# Patient Record
Sex: Female | Born: 1975 | Race: White | Hispanic: No | Marital: Single | Smoking: Never smoker
Health system: Southern US, Community
[De-identification: ages and names within clinical notes are randomized; demographics above are authoritative.]

## PROBLEM LIST (undated history)

## (undated) DIAGNOSIS — M51369 Other intervertebral disc degeneration, lumbar region without mention of lumbar back pain or lower extremity pain: Secondary | ICD-10-CM

## (undated) DIAGNOSIS — I10 Essential (primary) hypertension: Secondary | ICD-10-CM

## (undated) DIAGNOSIS — M5136 Other intervertebral disc degeneration, lumbar region: Secondary | ICD-10-CM

## (undated) DIAGNOSIS — I059 Rheumatic mitral valve disease, unspecified: Secondary | ICD-10-CM

## (undated) DIAGNOSIS — H9120 Sudden idiopathic hearing loss, unspecified ear: Secondary | ICD-10-CM

## (undated) DIAGNOSIS — R519 Headache, unspecified: Secondary | ICD-10-CM

## (undated) DIAGNOSIS — M5126 Other intervertebral disc displacement, lumbar region: Secondary | ICD-10-CM

## (undated) DIAGNOSIS — M549 Dorsalgia, unspecified: Secondary | ICD-10-CM

## (undated) DIAGNOSIS — C4491 Basal cell carcinoma of skin, unspecified: Secondary | ICD-10-CM

## (undated) HISTORY — DX: Basal cell carcinoma of skin, unspecified: C44.91

## (undated) HISTORY — DX: Other intervertebral disc degeneration, lumbar region: M51.36

## (undated) HISTORY — DX: Other intervertebral disc degeneration, lumbar region without mention of lumbar back pain or lower extremity pain: M51.369

## (undated) HISTORY — DX: Rheumatic mitral valve disease, unspecified: I05.9

## (undated) HISTORY — DX: Headache, unspecified: R51.9

## (undated) HISTORY — DX: Sudden idiopathic hearing loss, unspecified ear: H91.20

---

## 2006-10-25 ENCOUNTER — Emergency Department: Payer: Self-pay | Admitting: Emergency Medicine

## 2006-10-25 ENCOUNTER — Other Ambulatory Visit: Payer: Self-pay

## 2006-12-19 ENCOUNTER — Ambulatory Visit: Payer: Self-pay | Admitting: Neurology

## 2007-08-14 HISTORY — PX: OTHER SURGICAL HISTORY: SHX169

## 2013-05-20 ENCOUNTER — Other Ambulatory Visit: Payer: Self-pay | Admitting: Physical Medicine and Rehabilitation

## 2013-05-20 ENCOUNTER — Ambulatory Visit: Payer: Self-pay | Admitting: Physical Medicine and Rehabilitation

## 2013-05-20 DIAGNOSIS — M545 Low back pain, unspecified: Secondary | ICD-10-CM

## 2013-05-24 ENCOUNTER — Ambulatory Visit
Admission: RE | Admit: 2013-05-24 | Discharge: 2013-05-24 | Disposition: A | Discharge status: Home / Self Care | Payer: BC Managed Care – PPO | Source: Ambulatory Visit | Attending: Physical Medicine and Rehabilitation | Admitting: Physical Medicine and Rehabilitation

## 2013-05-24 DIAGNOSIS — M545 Low back pain, unspecified: Secondary | ICD-10-CM

## 2016-04-13 ENCOUNTER — Emergency Department: Payer: BC Managed Care – PPO

## 2016-04-13 ENCOUNTER — Emergency Department
Admission: EM | Admit: 2016-04-13 | Discharge: 2016-04-13 | Disposition: A | Discharge status: Home / Self Care | Payer: BC Managed Care – PPO | Attending: Emergency Medicine | Admitting: Emergency Medicine

## 2016-04-13 ENCOUNTER — Encounter: Payer: Self-pay | Admitting: Emergency Medicine

## 2016-04-13 ENCOUNTER — Other Ambulatory Visit: Payer: Self-pay

## 2016-04-13 DIAGNOSIS — I10 Essential (primary) hypertension: Secondary | ICD-10-CM | POA: Insufficient documentation

## 2016-04-13 DIAGNOSIS — R079 Chest pain, unspecified: Secondary | ICD-10-CM | POA: Insufficient documentation

## 2016-04-13 HISTORY — DX: Essential (primary) hypertension: I10

## 2016-04-13 HISTORY — DX: Dorsalgia, unspecified: M54.9

## 2016-04-13 LAB — CBC
HCT: 47.5 % — ABNORMAL HIGH (ref 35.0–47.0)
Hemoglobin: 15.9 g/dL (ref 12.0–16.0)
MCH: 27 pg (ref 26.0–34.0)
MCHC: 33.5 g/dL (ref 32.0–36.0)
MCV: 80.6 fL (ref 80.0–100.0)
PLATELETS: 223 10*3/uL (ref 150–440)
RBC: 5.89 MIL/uL — AB (ref 3.80–5.20)
RDW: 14.8 % — AB (ref 11.5–14.5)
WBC: 13.4 10*3/uL — ABNORMAL HIGH (ref 3.6–11.0)

## 2016-04-13 LAB — BASIC METABOLIC PANEL
Anion gap: 9 (ref 5–15)
BUN: 17 mg/dL (ref 6–20)
CALCIUM: 9.7 mg/dL (ref 8.9–10.3)
CO2: 22 mmol/L (ref 22–32)
CREATININE: 0.66 mg/dL (ref 0.44–1.00)
Chloride: 104 mmol/L (ref 101–111)
GFR calc non Af Amer: >60 mL/min (ref >60)
Glucose, Bld: 131 mg/dL — ABNORMAL HIGH (ref 65–99)
Potassium: 4.1 mmol/L (ref 3.5–5.1)
SODIUM: 135 mmol/L (ref 135–145)

## 2016-04-13 LAB — TROPONIN I: Troponin I: <0.03 ng/mL (ref <0.03)

## 2016-04-13 MED ORDER — IOPAMIDOL (ISOVUE-370) INJECTION 76%
75.0000 mL | Freq: Once | INTRAVENOUS | Status: AC | PRN
  Administered 2016-04-13: 75 mL via INTRAVENOUS
  Filled 2016-04-13: qty 75

## 2016-04-13 NOTE — ED Provider Notes (Signed)
[facility] Emergency Department Provider Note   ____________________________________________    I have reviewed the triage vital signs and the nursing notes.   HISTORY  Chief Complaint Chest Pain     HPI Zoe Daniels is a 40 y.o. female who presents with multiple complaints but she is most concerned about mild chest pain she is having. She describes it as a dull ache that is usually in the center of her chest but is occasionally on the right side as well. She reports it started intermittently yesterday and this morning was more constant. She reports it is more annoying than anything. She denies fevers or chills. She denies shortness of breath. No pleurisy. She also reports that she has been feeling fatigued over the last 3 weeks but she did she attributed this to her vacation in [place]. She denies diaphoresis. No history of heart attack. She sees a cardiologist yearly for left ventricular hypertrophy. No calf pain or swelling   Past Medical History:  Diagnosis Date  . Back pain   . Hypertension     There are no active problems to display for this patient.   History reviewed. No pertinent surgical history.  Prior to Admission medications   Not on File     Allergies Review of patient's allergies indicates no known allergies.  History reviewed. No pertinent family history.  Social History Social History  Substance Use Topics  . Smoking status: Never Smoker  . Smokeless tobacco: Never Used  . Alcohol use No    Review of Systems  Constitutional: No fever/chills Eyes: No visual changes.  ENT: No sore throat. Cardiovascular: As above Respiratory: Denies shortness of breath. Gastrointestinal: No abdominal pain.  No nausea, no vomiting.   Genitourinary: Negative for dysuria. Musculoskeletal: Negative for back pain. Skin: Negative for rash. Neurological: Negative for headaches or weakness  10-point ROS otherwise  negative.  ____________________________________________   PHYSICAL EXAM:  VITAL SIGNS: ED Triage Vitals [04/13/16 1356]  Enc Vitals Group     BP      Pulse      Resp      Temp      Temp src      SpO2      Weight 247 lb (112 kg)     Height 5\' 9"  (1.753 m)     Head Circumference      Peak Flow      Pain Score 3     Pain Loc      Pain Edu?      Excl. in [place]?     Constitutional: Alert and oriented. No acute distress. Pleasant and interactive Eyes: Conjunctivae are normal.  Head: Atraumatic. Nose: No congestion/rhinnorhea. Mouth/Throat: Mucous membranes are moist.   Neck:  Painless ROM Cardiovascular: Normal rate, regular rhythm. Grossly normal heart sounds.  Good peripheral circulation. Respiratory: Normal respiratory effort.  No retractions. Lungs CTAB. Gastrointestinal: Soft and nontender. No distention.  No CVA tenderness. Genitourinary: deferred Musculoskeletal: No lower extremity tenderness nor edema.  Warm and well perfused Neurologic:  Normal speech and language. No gross focal neurologic deficits are appreciated.  Skin:  Skin is warm, dry and intact. No rash noted. Psychiatric: Mood and affect are normal. Speech and behavior are normal.  ____________________________________________   LABS (all labs ordered are listed, but only abnormal results are displayed)  Labs Reviewed  BASIC METABOLIC PANEL - Abnormal; Notable for the following:       Result Value   Glucose, Bld  131 (*)    All other components within normal limits  CBC - Abnormal; Notable for the following:    WBC 13.4 (*)    RBC 5.89 (*)    HCT 47.5 (*)    RDW 14.8 (*)    All other components within normal limits  TROPONIN I   ____________________________________________  EKG  ED ECG REPORT I, Lavonia Drafts, the attending physician, personally viewed and interpreted this ECG.  Date: 04/13/2016 EKG Time: 1:58 PM Rate: 82 Rhythm: normal sinus rhythm QRS Axis: normal Intervals:  normal ST/T Wave abnormalities: normal Conduction Disturbances: none Narrative Interpretation: unremarkable  ____________________________________________  RADIOLOGY  Chest x-ray unremarkable CT angiography shows negative for PE ____________________________________________   PROCEDURES  Procedure(s) performed: No    Critical Care performed: No ____________________________________________   INITIAL IMPRESSION / ASSESSMENT AND PLAN / ED COURSE  Pertinent labs & imaging results that were available during my care of the patient were reviewed by me and considered in my medical decision making (see chart for details).  Lab work is overall unremarkable and the patient is well-appearing. EKG is reassuring. Given onset of fatigue after a flight and now chest pain I have some concern about a PE, we will obtain CT angio and reevaluate.  Clinical Course  Value Comment By Time  CO2: 22 (Reviewed) Lavonia Drafts, MD 09/01 1751  CT angiography is normal, patient is not a smoker. Second troponin is normal. Patient has follow-up with cardiology. She does return if worsening of her symptoms but overall her workup is reassuring ____________________________________________   FINAL CLINICAL IMPRESSION(S) / ED DIAGNOSES  Final diagnoses:  Chest pain, unspecified chest pain type      NEW MEDICATIONS STARTED DURING THIS VISIT:  New Prescriptions   No medications on file     Note:  This document was prepared using Dragon voice recognition software and may include unintentional dictation errors.    Lavonia Drafts, MD 04/13/16 [phone]

## 2016-04-13 NOTE — ED Triage Notes (Signed)
Pt to ed with c/o chest pain intermittently since Monday, worse sharp pain on Wednesday. Today, headache, chest pain, sob, lethargy.

## 2016-09-11 ENCOUNTER — Emergency Department: Payer: Worker's Compensation

## 2016-09-11 ENCOUNTER — Emergency Department
Admission: EM | Admit: 2016-09-11 | Discharge: 2016-09-11 | Disposition: A | Discharge status: Home / Self Care | Payer: Worker's Compensation | Attending: Emergency Medicine | Admitting: Emergency Medicine

## 2016-09-11 DIAGNOSIS — I10 Essential (primary) hypertension: Secondary | ICD-10-CM | POA: Insufficient documentation

## 2016-09-11 DIAGNOSIS — Z23 Encounter for immunization: Secondary | ICD-10-CM | POA: Diagnosis not present

## 2016-09-11 DIAGNOSIS — Y92480 Sidewalk as the place of occurrence of the external cause: Secondary | ICD-10-CM | POA: Diagnosis not present

## 2016-09-11 DIAGNOSIS — W01198A Fall on same level from slipping, tripping and stumbling with subsequent striking against other object, initial encounter: Secondary | ICD-10-CM | POA: Insufficient documentation

## 2016-09-11 DIAGNOSIS — Y990 Civilian activity done for income or pay: Secondary | ICD-10-CM | POA: Diagnosis not present

## 2016-09-11 DIAGNOSIS — S5002XA Contusion of left elbow, initial encounter: Secondary | ICD-10-CM | POA: Diagnosis not present

## 2016-09-11 DIAGNOSIS — S59902A Unspecified injury of left elbow, initial encounter: Secondary | ICD-10-CM | POA: Diagnosis present

## 2016-09-11 DIAGNOSIS — Y9301 Activity, walking, marching and hiking: Secondary | ICD-10-CM | POA: Diagnosis not present

## 2016-09-11 DIAGNOSIS — W19XXXA Unspecified fall, initial encounter: Secondary | ICD-10-CM

## 2016-09-11 DIAGNOSIS — R51 Headache: Secondary | ICD-10-CM | POA: Diagnosis not present

## 2016-09-11 HISTORY — DX: Other intervertebral disc displacement, lumbar region: M51.26

## 2016-09-11 MED ORDER — TETANUS-DIPHTH-ACELL PERTUSSIS 5-2.5-18.5 LF-MCG/0.5 IM SUSP
0.5000 mL | Freq: Once | INTRAMUSCULAR | Status: AC
  Administered 2016-09-11: 0.5 mL via INTRAMUSCULAR
  Filled 2016-09-11: qty 0.5

## 2016-09-11 MED ORDER — MELOXICAM 15 MG PO TABS: 15.0000 mg | ORAL_TABLET | Freq: Every day | ORAL | 0 refills | Status: DC

## 2016-09-11 NOTE — ED Notes (Signed)
Pt fell while at work on ice and injured her left arm/elbow - c/o pain but still has full ROM - pt did hit head but did not loss consciousness and requested not to have head evaluated - this was discussed with Dr Karma Greaser

## 2016-09-11 NOTE — ED Notes (Signed)
Pt denies loss of consciousness - denies nausea/vomiting - denies headache - discussed with Dr Karma Greaser and he states pt does not need a CT scan and to make her a level 4

## 2016-09-11 NOTE — ED Provider Notes (Signed)
[facility] Emergency Department Provider Note  ____________________________________________  Time seen: Approximately 4:37 PM  I have reviewed the triage vital signs and the nursing notes.   HISTORY  Chief Complaint Fall    HPI Zoe Daniels is a 41 y.o. female [facility] emergency department complaining of left elbow pain status post fall. Patient states that she was walking this morning when she slipped on a patch of ice falling backwards landing on her left elbow. Patient reports that she did hit her head and has had a headache since denies any loss consciousness. She also reports some neck stiffness radiating into her left shoulder. Patient is most concerned about her left elbow pain, swelling, abrasion. Patient reports that she has had some numbness and tingling running from her elbow down into her left hand. No other injury or complaint. She is unsure of her last tetanus shot.  When discussing patient's injuries, she adamantly refuses CT scan of the head and neck. She requests "only want an x-ray of my elbow to make sure it didn't break something.." I have advised patient that due to the mechanism of injury I cannot rule out abnormality both intracranially, or cervical spine.She verbalizes understanding of this and adamantly refuses head CT and cervical spine imaging.   Past Medical History:  Diagnosis Date  . Back pain   . Hypertension   . Lumbar herniated disc     There are no active problems to display for this patient.   History reviewed. No pertinent surgical history.  Prior to Admission medications   Medication Sig Start Date End Date Taking? Authorizing Provider  meloxicam (MOBIC) 15 MG tablet Take 1 tablet (15 mg total) by mouth daily. 09/11/16   Charline Bills Cuthriell, PA-C    Allergies Patient has no known allergies.  No family history on file.  Social History Social History  Substance Use Topics  . Smoking status: Never Smoker  .  Smokeless tobacco: Never Used  . Alcohol use No     Review of Systems  Constitutional: No fever/chills Eyes: No visual changes.  Cardiovascular: no chest pain. Respiratory: no cough. No SOB. Gastrointestinal: No abdominal pain.  No nausea, no vomiting.  Musculoskeletal: Positive for left elbow pain Skin: As it for abrasion to the posterior left elbow. Neurological: Positive for headache but denies focal weakness or numbness. 10-point ROS otherwise negative.  ____________________________________________   PHYSICAL EXAM:  VITAL SIGNS: ED Triage Vitals  Enc Vitals Group     BP 09/11/16 1513 (!) 151/103     Pulse Rate 09/11/16 1513 64     Resp 09/11/16 1513 16     Temp 09/11/16 1513 98.1 F (36.7 C)     Temp Source 09/11/16 1513 Oral     SpO2 09/11/16 1513 100 %     Weight 09/11/16 1513 237 lb (107.5 kg)     Height 09/11/16 1513 5\' 9"  (1.753 m)     Head Circumference --      Peak Flow --      Pain Score 09/11/16 1514 7     Pain Loc --      Pain Edu? --      Excl. in [place]? --      Constitutional: Alert and oriented. Well appearing and in no acute distress. Eyes: Conjunctivae are normal. PERRL. EOMI. Head: Atraumatic. No ecchymosis, contusion, abrasion, laceration noted. Patient is nontender to palpation over the osseous structures of the skull. No battle signs. No raccoon eyes. No cervical symptoms  with drainage from the ears or nares. ENT:      Ears:       Nose: No congestion/rhinnorhea.      Mouth/Throat: Mucous membranes are moist.  Neck: No stridor.  Diffuse, midline cervical spine tenderness to palpation. No palpable abnormality. No step-off. Patient is also tender to palpation aside paraspinal muscle group.  Cardiovascular: Normal rate, regular rhythm. Normal S1 and S2.  Good peripheral circulation. Respiratory: Normal respiratory effort without tachypnea or retractions. Lungs CTAB. Good air entry to the bases with no decreased or absent breath  sounds. Musculoskeletal: Full range of motion to all extremities. No gross deformities appreciated. Full range of motion to left elbow upon inspection. Patient is tender to palpation of the olecranon process. No palpable abnormality. Patient is otherwise nontender to palpation. Full range of motion. Examination of the shoulder and wrist are unremarkable. Sensation intact 5 digits. Capillary refill less than 2 seconds all digits. Neurologic:  Normal speech and language. No gross focal neurologic deficits are appreciated.  Skin:  Skin is warm, dry and intact. No rash noted. Psychiatric: Mood and affect are normal. Speech and behavior are normal. Patient exhibits appropriate insight and judgement.   ____________________________________________   LABS (all labs ordered are listed, but only abnormal results are displayed)  Labs Reviewed - No data to display ____________________________________________  EKG   ____________________________________________  RADIOLOGY Diamantina Providence Cuthriell, personally viewed and evaluated these images (plain radiographs) as part of my medical decision making, as well as reviewing the written report by the radiologist.  Dg Elbow Complete Left  Result Date: 09/11/2016 CLINICAL DATA:  [place] today on ice onto LEFT elbow, laceration and pain posteriorly, initial encounter EXAM: LEFT ELBOW - COMPLETE 3+ VIEW COMPARISON:  None FINDINGS: Osseous mineralization normal. Joint spaces preserved. No acute fracture, dislocation or bone destruction. Minimal dorsal soft tissue swelling overlying the olecranon. No joint effusion. IMPRESSION: Normal exam. Electronically Signed   By: Lavonia Dana M.D.   On: 09/11/2016 17:13    ____________________________________________    PROCEDURES  Procedure(s) performed:    Procedures    Medications  Tdap (BOOSTRIX) injection 0.5 mL (0.5 mLs Intramuscular Given 09/11/16 1652)      ____________________________________________   INITIAL IMPRESSION / ASSESSMENT AND PLAN / ED COURSE  Pertinent labs & imaging results that were available during my care of the patient were reviewed by me and considered in my medical decision making (see chart for details).  Review of the [place] CSRS was performed in accordance of the [place] prior to dispensing any controlled drugs.     Patient's diagnosis is consistent with left elbow contusion after fall. Patient did hit her head and was complaining of neck pain and headache but adamantly refused CT of the head and neck. Patient requested x-ray of the left elbow only. This returned. Her results with no indication of acute osseous abnormality. Exam is reassuring.. Patient will be discharged home with prescriptions for anti-inflammatories for symptom control. Patient is on chronic pain medication and she may take same.. Patient is to follow up with primary care as needed or otherwise directed. Patient is given ED precautions to return to the ED for any worsening or new symptoms.     ____________________________________________  FINAL CLINICAL IMPRESSION(S) / ED DIAGNOSES  Final diagnoses:  Fall, initial encounter  Contusion of left elbow, initial encounter      NEW MEDICATIONS STARTED DURING THIS VISIT:  Discharge Medication List as of 09/11/2016  5:28 PM    START  taking these medications   Details  meloxicam (MOBIC) 15 MG tablet Take 1 tablet (15 mg total) by mouth daily., Starting Tue 09/11/2016, Print            This chart was dictated using voice recognition software/Dragon. Despite best efforts to proofread, errors can occur which can change the meaning. Any change was purely unintentional.    Darletta Moll, PA-C 09/11/16 [place], MD 09/11/16 2250

## 2016-09-11 NOTE — ED Triage Notes (Signed)
Pt reports that she fell at work today - reports that she fell from a standing position - slid on ice and hit head on pavement and landed on left arm and elbow which are now hurting - pt is able to move left arm but is c/o numbness in left hand and fingers

## 2018-05-07 ENCOUNTER — Other Ambulatory Visit: Payer: Self-pay | Admitting: Physical Medicine and Rehabilitation

## 2018-05-07 DIAGNOSIS — M5412 Radiculopathy, cervical region: Secondary | ICD-10-CM

## 2018-05-07 DIAGNOSIS — M5416 Radiculopathy, lumbar region: Secondary | ICD-10-CM

## 2018-05-14 ENCOUNTER — Other Ambulatory Visit: Payer: Self-pay | Admitting: Physical Medicine and Rehabilitation

## 2018-05-14 DIAGNOSIS — M5416 Radiculopathy, lumbar region: Secondary | ICD-10-CM

## 2018-05-18 ENCOUNTER — Ambulatory Visit
Admission: RE | Admit: 2018-05-18 | Discharge: 2018-05-18 | Disposition: A | Discharge status: Home / Self Care | Payer: BC Managed Care – PPO | Source: Ambulatory Visit | Attending: Physical Medicine and Rehabilitation | Admitting: Physical Medicine and Rehabilitation

## 2018-05-18 DIAGNOSIS — M5416 Radiculopathy, lumbar region: Secondary | ICD-10-CM

## 2018-05-22 ENCOUNTER — Ambulatory Visit: Payer: Self-pay

## 2018-09-10 ENCOUNTER — Other Ambulatory Visit: Payer: Self-pay | Admitting: Physician Assistant

## 2018-09-10 DIAGNOSIS — H918X9 Other specified hearing loss, unspecified ear: Secondary | ICD-10-CM

## 2018-09-18 ENCOUNTER — Other Ambulatory Visit: Payer: Self-pay

## 2018-09-18 ENCOUNTER — Ambulatory Visit
Admission: RE | Admit: 2018-09-18 | Discharge: 2018-09-18 | Disposition: A | Discharge status: Home / Self Care | Payer: BC Managed Care – PPO | Source: Ambulatory Visit | Attending: Physician Assistant | Admitting: Physician Assistant

## 2018-09-18 DIAGNOSIS — H918X9 Other specified hearing loss, unspecified ear: Secondary | ICD-10-CM

## 2018-09-18 MED ORDER — GADOBENATE DIMEGLUMINE 529 MG/ML IV SOLN
20.0000 mL | Freq: Once | INTRAVENOUS | Status: AC | PRN
  Administered 2018-09-18: 20 mL via INTRAVENOUS

## 2018-12-29 ENCOUNTER — Encounter: Payer: Self-pay | Admitting: *Deleted

## 2018-12-29 ENCOUNTER — Ambulatory Visit (INDEPENDENT_AMBULATORY_CARE_PROVIDER_SITE_OTHER): Payer: BC Managed Care – PPO | Admitting: Neurology

## 2018-12-29 ENCOUNTER — Other Ambulatory Visit: Payer: Self-pay

## 2018-12-29 VITALS — BP 156/98 | HR 83 | Temp 97.7°F | Ht 69.0 in | Wt 246.0 lb

## 2018-12-29 DIAGNOSIS — G43709 Chronic migraine without aura, not intractable, without status migrainosus: Secondary | ICD-10-CM | POA: Diagnosis not present

## 2018-12-29 DIAGNOSIS — R42 Dizziness and giddiness: Secondary | ICD-10-CM

## 2018-12-29 DIAGNOSIS — IMO0002 Reserved for concepts with insufficient information to code with codable children: Secondary | ICD-10-CM

## 2018-12-29 MED ORDER — SUMATRIPTAN SUCCINATE 50 MG PO TABS: 50.0000 mg | ORAL_TABLET | ORAL | 6 refills | Status: DC | PRN

## 2018-12-29 MED ORDER — ONDANSETRON 4 MG PO TBDP: 4.0000 mg | ORAL_TABLET | Freq: Three times a day (TID) | ORAL | 6 refills | Status: DC | PRN

## 2018-12-29 MED ORDER — TOPIRAMATE 100 MG PO TABS: 100.0000 mg | ORAL_TABLET | Freq: Two times a day (BID) | ORAL | 11 refills | Status: DC
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
## 2019-03-17 ENCOUNTER — Telehealth: Payer: Self-pay | Admitting: Neurology

## 2019-03-17 NOTE — Telephone Encounter (Signed)
Pt called in and stated the meds that were prescribed to her arent working at all she has tried rotating them to help but there was no improvement .  SUMAtriptan (IMITREX) 50 MG tablet  topiramate (TOPAMAX) 100 MG tablet

## 2019-03-18 NOTE — Telephone Encounter (Signed)
Called the patient to inform her of the recommendation that Dr Krista Blue has made. There was no answer LVM informing the pt to call back to discuss.

## 2019-03-18 NOTE — Telephone Encounter (Signed)
I can add on nortriptyline 10mg , titrating to 20mg  qhs as preventive medications, keep follow up appt in Sept, if she wants to come in earlier, ok to reschedule earlier.

## 2019-03-19 MED ORDER — NORTRIPTYLINE HCL 10 MG PO CAPS: ORAL_CAPSULE | ORAL | 0 refills | Status: DC

## 2019-03-19 NOTE — Telephone Encounter (Signed)
Pt returned call and I was able to review with her that Dr Krista Blue is willing to try another preventative medication. Advised to continue using the topiramate for prevention and imitrex should only be used when she feels a migraine coming on as a emergency relief medication. Informed the patient that Dr. Krista Blue would recommend trying a medication called nortipyline. Advised on the directions informed by Dr Krista Blue. Informed this will be used also as a prevention for migraines. Informed that we will send this order over to her pharmacy on file which was confirmed. Advised to take this and keep the upcoming apt in sept to discuss If she is getting relief. Pt verbalized understanding.

## 2019-03-19 NOTE — Addendum Note (Signed)
Addended by: Darleen Crocker on: 03/19/2019 08:17 AM   Modules accepted: Orders

## 2019-04-24 ENCOUNTER — Other Ambulatory Visit: Payer: Self-pay | Admitting: Neurology

## 2019-04-29 ENCOUNTER — Ambulatory Visit: Payer: BC Managed Care – PPO | Admitting: Internal Medicine

## 2019-04-29 ENCOUNTER — Other Ambulatory Visit: Payer: Self-pay

## 2019-04-29 ENCOUNTER — Encounter: Payer: Self-pay | Admitting: Internal Medicine

## 2019-04-29 VITALS — BP 160/112 | HR 95 | Ht 69.0 in | Wt 247.5 lb

## 2019-04-29 DIAGNOSIS — I059 Rheumatic mitral valve disease, unspecified: Secondary | ICD-10-CM

## 2019-04-29 DIAGNOSIS — E785 Hyperlipidemia, unspecified: Secondary | ICD-10-CM

## 2019-04-29 DIAGNOSIS — R0789 Other chest pain: Secondary | ICD-10-CM | POA: Insufficient documentation

## 2019-04-29 DIAGNOSIS — I10 Essential (primary) hypertension: Secondary | ICD-10-CM | POA: Diagnosis not present

## 2019-04-29 DIAGNOSIS — R002 Palpitations: Secondary | ICD-10-CM | POA: Diagnosis not present

## 2019-04-29 MED ORDER — LISINOPRIL 20 MG PO TABS: 20.0000 mg | ORAL_TABLET | Freq: Every day | ORAL | 3 refills | Status: DC
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
## 2019-04-29 NOTE — Patient Instructions (Addendum)
Medication Instructions:  Your physician has recommended you make the following change in your medication:   START Lisinopril 20mg  daily. An Rx has been sent to your pharmacy  If you need a refill on your cardiac medications before your next appointment, please call your pharmacy.   Lab work: Teacher, English as a foreign language recommends that you return for lab work in: 2 weeks (Bmet)  If you have labs (blood work) drawn today and your tests are completely normal, you will receive your results only by: Marland Kitchen MyChart Message (if you have MyChart) OR . A paper copy in the mail If you have any lab test that is abnormal or we need to change your treatment, we will call you to review the results.  Testing/Procedures: None ordered  Follow-Up: At [facility], you and your health needs are our priority.  As part of our continuing mission to provide you with exceptional heart care, we have created designated Provider Care Teams.  These Care Teams include your primary Cardiologist (physician) and Advanced Practice Providers (APPs -  Physician Assistants and Nurse Practitioners) who all work together to provide you with the care you need, when you need it. You will need a follow up appointment in 4 weeks.   You may see  Dr. Saunders Revel or one of the following Advanced Practice Providers on your designated Care Team:   Murray Hodgkins, NP Christell Faith, PA-C . Marrianne Mood, PA-C  Any Other Special Instructions Will Be Listed Below (If Applicable). N/A

## 2019-04-30 LAB — BASIC METABOLIC PANEL
BUN/Creatinine Ratio: 13 (ref 9–23)
BUN: 10 mg/dL (ref 6–24)
CO2: 22 mmol/L (ref 20–29)
Calcium: 9.3 mg/dL (ref 8.7–10.2)
Chloride: 98 mmol/L (ref 96–106)
Creatinine, Ser: 0.77 mg/dL (ref 0.57–1.00)
GFR calc Af Amer: 110 mL/min/{1.73_m2} (ref >59)
GFR calc non Af Amer: 96 mL/min/{1.73_m2} (ref >59)
Glucose: 193 mg/dL — ABNORMAL HIGH (ref 65–99)
Potassium: 3.8 mmol/L (ref 3.5–5.2)
Sodium: 136 mmol/L (ref 134–144)

## 2019-05-01 ENCOUNTER — Ambulatory Visit: Payer: BC Managed Care – PPO | Admitting: Neurology

## 2019-05-05 NOTE — Addendum Note (Signed)
Addended by: Britt Bottom on: 05/05/2019 04:33 PM   Modules accepted: Orders
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
## 2019-05-13 ENCOUNTER — Encounter: Payer: Self-pay | Admitting: Neurology

## 2019-05-13 ENCOUNTER — Ambulatory Visit: Payer: BC Managed Care – PPO | Admitting: Neurology

## 2019-05-13 ENCOUNTER — Other Ambulatory Visit: Payer: Self-pay

## 2019-05-13 ENCOUNTER — Telehealth: Payer: Self-pay | Admitting: Neurology

## 2019-05-13 ENCOUNTER — Other Ambulatory Visit
Admission: RE | Admit: 2019-05-13 | Discharge: 2019-05-13 | Disposition: A | Discharge status: Home / Self Care | Payer: BC Managed Care – PPO | Source: Ambulatory Visit | Attending: Internal Medicine | Admitting: Internal Medicine

## 2019-05-13 VITALS — BP 139/75 | HR 114 | Temp 97.7°F | Ht 69.0 in | Wt 241.8 lb

## 2019-05-13 DIAGNOSIS — G43709 Chronic migraine without aura, not intractable, without status migrainosus: Secondary | ICD-10-CM

## 2019-05-13 DIAGNOSIS — IMO0002 Reserved for concepts with insufficient information to code with codable children: Secondary | ICD-10-CM

## 2019-05-13 DIAGNOSIS — I10 Essential (primary) hypertension: Secondary | ICD-10-CM | POA: Diagnosis present

## 2019-05-13 LAB — BASIC METABOLIC PANEL
Anion gap: 14 (ref 5–15)
BUN: 16 mg/dL (ref 6–20)
CO2: 23 mmol/L (ref 22–32)
Calcium: 8.7 mg/dL — ABNORMAL LOW (ref 8.9–10.3)
Chloride: 97 mmol/L — ABNORMAL LOW (ref 98–111)
Creatinine, Ser: 0.75 mg/dL (ref 0.44–1.00)
GFR calc Af Amer: >60 mL/min (ref >60)
GFR calc non Af Amer: >60 mL/min (ref >60)
Glucose, Bld: 212 mg/dL — ABNORMAL HIGH (ref 70–99)
Potassium: 3.5 mmol/L (ref 3.5–5.1)
Sodium: 134 mmol/L — ABNORMAL LOW (ref 135–145)

## 2019-05-13 MED ORDER — RIZATRIPTAN BENZOATE 10 MG PO TABS: 10.0000 mg | ORAL_TABLET | ORAL | 0 refills | Status: DC | PRN

## 2019-05-13 MED ORDER — AIMOVIG 70 MG/ML ~~LOC~~ SOAJ: 70.0000 mg | SUBCUTANEOUS | 5 refills | Status: DC

## 2019-05-13 NOTE — Patient Instructions (Signed)
1. We will stop nortriptyline 2. Continue Topamax 3. We will start Aimovig 70 mg monthly injection  4. Stop Imitrex, switch to Maxalt

## 2019-05-13 NOTE — Telephone Encounter (Signed)
The patient has asked that her office notes be sent to her ENT, Dr. Pryor Ochoa at [facility] once Dr. Krista Blue has signed off on visit today.

## 2019-05-13 NOTE — Progress Notes (Signed)
I have reviewed and agreed above plan. 

## 2019-05-25 NOTE — Progress Notes (Signed)
Follow-up Outpatient Visit Date: 05/27/2019  Primary Care Provider: Idelle Crouch, MD [place] [address]  Chief Complaint: Follow-up hypertension  HPI:  Ms. Waight is a 43 y.o. year-old female with history of hypertension, hyperlipidemia, mitral valve disorder, who presents for follow-up of hypertension.  I met her last month for evaluation of dizziness (attributed to Mnire's disease) and elevated blood pressure complicated by by recent medication changes targeting her Mnire's disease.  She also reported worsening palpitations over the last few months as well as occasional tightness in her chest when anxious.  We agreed to restart lisinopril 20 mg daily with continuation of triamterene-HCTZ.  Today, Ms. Vokes reports that her blood pressure has been much better controlled.  She is tolerating lisinopril well.  Her vertigo remains about the same.  She was taken off nortriptyline by her neurologist and feels like her mental acuity has improved.  However, headaches seem to have worsened.  She is now using Maxalt several days a week but feels like this medication is aborting her headaches better than Imitrex.  She denies chest pain, shortness of breath, and palpitations.  Somewhat recently commented that her toes seem swollen.  However, Ms. Dolinar has not had any other edema.  --------------------------------------------------------------------------------------------------  Cardiovascular History & Procedures: Cardiovascular Problems:  Hypertension.  Palpitations.  Dyspnea on exertion.  Risk Factors:  Hypertension, hyperlipidemia, and obesity  Cath/PCI:  None  CV Surgery:  None  EP Procedures and Devices:  None  Non-Invasive Evaluation(s):  Exercise stress echo (05/14/2016, [facility]): Normal LV function at baseline with hyperkinetic response to stress.  No evidence of ischemia.  Recent CV Pertinent Labs: Lab  Results  Component Value Date   K 3.5 05/13/2019   BUN 16 05/13/2019   BUN 10 04/29/2019   CREATININE 0.75 05/13/2019    Past medical and surgical history were reviewed and updated in EPIC.  Current Meds  Medication Sig  . Erenumab-aooe (AIMOVIG) 70 MG/ML SOAJ Inject 70 mg into the skin every 30 (thirty) days.  Marland Kitchen gabapentin (NEURONTIN) 300 MG capsule 300 mg 2 (two) times daily.  Marland Kitchen HYDROcodone-acetaminophen (NORCO/VICODIN) 5-325 MG tablet TAKE 1/2 TO 1 TABLET TWICE A DAY AS NEEDED  . lisinopril (ZESTRIL) 20 MG tablet Take 1 tablet (20 mg total) by mouth daily.  . ondansetron (ZOFRAN ODT) 4 MG disintegrating tablet Take 1 tablet (4 mg total) by mouth every 8 (eight) hours as needed.  . rizatriptan (MAXALT) 10 MG tablet Take 1 tablet (10 mg total) by mouth as needed for migraine. May repeat in 2 hours if needed  . tiZANidine (ZANAFLEX) 4 MG capsule Take 8 mg by mouth as needed.   . topiramate (TOPAMAX) 100 MG tablet Take 1 tablet (100 mg total) by mouth 2 (two) times daily. (Patient taking differently: Take 100 mg by mouth daily. )  . triamterene-hydrochlorothiazide (DYAZIDE) 37.5-25 MG capsule Take by mouth daily.    Allergies: Prednisone  Social History   Tobacco Use  . Smoking status: Never Smoker  . Smokeless tobacco: Never Used  Substance Use Topics  . Alcohol use: No  . Drug use: No    Family History  Problem Relation Age of Onset  . Alcoholism Mother   . Arthritis Mother   . Glaucoma Mother   . Hypertension Father   . Stroke Father   . Heart attack Paternal Grandfather     Review of Systems: Ms. Kanner notes abrasions on her right foot that have  been slow to heal.  She does not have any pain in her legs with ambulation.  Otherwise, a 12-system review of systems was performed and was negative except as noted in the HPI.  --------------------------------------------------------------------------------------------------  Physical Exam: BP 122/80 (BP Location: Left  Arm, Patient Position: Sitting, Cuff Size: Normal)   Pulse 86   Temp 98.1 F (36.7 C)   Ht '5\' 9"'  (1.753 m)   Wt 242 lb 8 oz (110 kg)   BMI 35.81 kg/m   General: NAD. HEENT: No conjunctival pallor or scleral icterus.  Facemask in place. Neck: Supple without lymphadenopathy, thyromegaly, JVD, or HJR. Lungs: Normal work of breathing. Clear to auscultation bilaterally without wheezes or crackles. Heart: Regular rate and rhythm without murmurs, rubs, or gallops. Abd: Bowel sounds present. Soft, NT/ND without hepatosplenomegaly Ext: No lower extremity edema.  Pedal pulses are 1+ bilaterally with normal capillary refill. Skin: Warm and dry.  Superficial abrasion with eschar noted on the dorsum of the right foot.  EKG: Normal sinus rhythm with possible left atrial enlargement.  Otherwise, no significant abnormality.  Lab Results  Component Value Date   WBC 13.4 (H) 04/13/2016   HGB 15.9 04/13/2016   HCT 47.5 (H) 04/13/2016   MCV 80.6 04/13/2016   PLT 223 04/13/2016    Lab Results  Component Value Date   NA 134 (L) 05/13/2019   K 3.5 05/13/2019   CL 97 (L) 05/13/2019   CO2 23 05/13/2019   BUN 16 05/13/2019   CREATININE 0.75 05/13/2019   GLUCOSE 212 (H) 05/13/2019    No results found for: CHOL, HDL, LDLCALC, LDLDIRECT, TRIG, CHOLHDL  --------------------------------------------------------------------------------------------------  ASSESSMENT AND PLAN: Hypertension: Blood pressure improved today at 122/80.  We will continue current doses of lisinopril and triamterene-HCTZ.  Follow-up BMP 2 weeks after reinitiation of lisinopril showed normal renal function and potassium.  Vertigo: Stable.  Continue management per ENT.  Decreased pedal pulses and right foot wound: Ms. Rubano is concerned that abrasions on her right foot have been slow to heal.  Pedal pulses are mildly diminished today.  We have agreed to obtain ABIs to exclude significant PAD.  Elevated glucose: Random  glucose measurements have been elevated, up to 212.  Ms. Teska is in the process of trying to establish with Dr. Doy Hutching for further management.  Follow-up: Return to clinic in 6 months.  Nelva Bush, MD 05/27/2019 1:59 PM

## 2019-05-27 ENCOUNTER — Telehealth: Payer: Self-pay | Admitting: Neurology

## 2019-05-27 ENCOUNTER — Other Ambulatory Visit: Payer: Self-pay

## 2019-05-27 ENCOUNTER — Ambulatory Visit (INDEPENDENT_AMBULATORY_CARE_PROVIDER_SITE_OTHER): Payer: BC Managed Care – PPO | Admitting: Internal Medicine

## 2019-05-27 ENCOUNTER — Encounter: Payer: Self-pay | Admitting: Internal Medicine

## 2019-05-27 VITALS — BP 122/80 | HR 86 | Temp 98.1°F | Ht 69.0 in | Wt 242.5 lb

## 2019-05-27 DIAGNOSIS — S81801A Unspecified open wound, right lower leg, initial encounter: Secondary | ICD-10-CM | POA: Diagnosis not present

## 2019-05-27 DIAGNOSIS — I10 Essential (primary) hypertension: Secondary | ICD-10-CM | POA: Diagnosis not present

## 2019-05-27 DIAGNOSIS — R0989 Other specified symptoms and signs involving the circulatory and respiratory systems: Secondary | ICD-10-CM | POA: Insufficient documentation

## 2019-05-27 DIAGNOSIS — R7309 Other abnormal glucose: Secondary | ICD-10-CM | POA: Diagnosis not present

## 2019-05-27 DIAGNOSIS — R42 Dizziness and giddiness: Secondary | ICD-10-CM

## 2019-05-27 NOTE — Telephone Encounter (Signed)
LVM requesting call back.

## 2019-05-27 NOTE — Telephone Encounter (Signed)
Pt returned call. Please call back when available. 

## 2019-05-27 NOTE — Telephone Encounter (Signed)
Called patient who stated since she's off nortriptyline she is able to function better.The  rizatriptan is much better, and it'll stop her headache. However she's taking it about every other day.  She took 1st Aimovig injection at end of Sept, takes Topamax 100 mg twice daily. I advised her that she cannot take more than 10 rizatriptan in a month, can lead to chronic headache. She asked that if she uses all rizatriptan before her refill on 10/30, can she take her leftover imitrex. She has about 7 tabs left.  She stated imitrex works but it makes her drowsy. But she'll take it if "she has to". She's asking for advice. I stated will send to NP and let her know. Patient verbalized understanding, appreciation.

## 2019-05-27 NOTE — Patient Instructions (Signed)
Medication Instructions:  Your physician recommends that you continue on your current medications as directed. Please refer to the Current Medication list given to you today.  If you need a refill on your cardiac medications before your next appointment, please call your pharmacy.   Lab work: NONE If you have labs (blood work) drawn today and your tests are completely normal, you will receive your results only by: Marland Kitchen MyChart Message (if you have MyChart) OR . A paper copy in the mail If you have any lab test that is abnormal or we need to change your treatment, we will call you to review the results.  Testing/Procedures: Your physician has requested that you have an ankle brachial index (ABI). During this test an ultrasound and blood pressure cuff are used to evaluate the arteries that supply the arms and legs with blood. Allow thirty minutes for this exam. There are no restrictions or special instructions.  Your physician has requested that you have a lower extremity arterial doppler- During this test, ultrasound is used to evaluate arterial blood flow in the legs. Allow approximately one hour for this exam.    Follow-Up: At [facility], you and your health needs are our priority.  As part of our continuing mission to provide you with exceptional heart care, we have created designated Provider Care Teams.  These Care Teams include your primary Cardiologist (physician) and Advanced Practice Providers (APPs -  Physician Assistants and Nurse Practitioners) who all work together to provide you with the care you need, when you need it. You will need a follow up appointment in 6 months.   Please call our office 2 months in advance to schedule this appointment.  You may see DR Harrell Gave END or one of the following Advanced Practice Providers on your designated Care Team:   Murray Hodgkins, NP Christell Faith, PA-C . Marrianne Mood, PA-C   Ankle-Brachial Index Test Why am I having this test?  The ankle-brachial index (ABI) test is used to diagnose peripheral vascular disease (PVD). PVD is also known as peripheral arterial disease (PAD). PVD is the blocking or hardening of the arteries anywhere within the circulatory system beyond the heart. PVD is caused by:  Cholesterol deposits in your blood vessels (atherosclerosis). This is the most common cause of this condition.  Irritation and swelling (inflammation) in the blood vessels.  Blood clots in the vessels. Cholesterol deposits cause arteries to narrow. Normal delivery of oxygen to your tissues is affected, causing muscle pain and fatigue. This is called claudication. PVD means that there may also be a buildup of cholesterol:  In your heart. This increases the risk of heart attacks.  In your brain. This increases the risk of strokes. What is being tested? The ankle-brachial index test measures the blood flow in your arms and legs. The blood flow will show if blood vessels in your legs have been narrowed by cholesterol deposits. How do I prepare for this test?  Wear loose clothing.  Do not use any tobacco products, including cigarettes, chewing tobacco, or e-cigarettes, for at least 30 minutes before the test. What happens during the test?  1. You will lie down in a resting position. 2. Your health care provider will use a blood pressure machine and a small ultrasound device (Doppler) to measure the systolic pressures on your upper arms and ankles. Systolic pressure is the pressure inside your arteries when your heart pumps. 3. Systolic pressure measurements will be taken several times, and at several points, on  both the ankle and the arm. 4. Your health care provider will divide the highest systolic pressure of the ankle by the highest systolic pressure of the arm. The result is the ankle-brachial pressure ratio, or ABI. Sometimes this test will be repeated after you have exercised on a treadmill for 5 minutes. You may have leg  pain during the exercise portion of the test if you suffer from PVD. If the index number drops after exercise, this may show that PVD is present. How are the results reported? Your test results will be reported as a value that shows the ratio of your ankle pressure to your arm pressure (ABI ratio). Your health care provider will compare your results to normal ranges that were established after testing a large group of people (reference ranges). Reference ranges may vary among labs and hospitals. For this test, a common reference range is:  ABI ratio of 0.9 to 1.3. What do the results mean? An ABI ratio that is below the reference range is considered abnormal and may indicate PVD in the legs. Talk with your health care provider about what your results mean. Questions to ask your health care provider Ask your health care provider, or the department that is doing the test:  When will my results be ready?  How will I get my results?  What are my treatment options?  What other tests do I need?  What are my next steps? Summary  The ankle-brachial index (ABI) test is used to diagnose peripheral vascular disease (PVD). PVD is also known as peripheral arterial disease (PAD).  The ankle-brachial index test measures the blood flow in your arms and legs.  The highest systolic pressure of the ankle is divided by the highest systolic pressure of the arm. The result is the ABI ratio.  An ABI ratio that is below 0.9 is considered abnormal and may indicate PVD in the legs. This information is not intended to replace advice given to you by your health care provider. Make sure you discuss any questions you have with your health care provider. Document Released: 08/03/2004 Document Revised: 05/22/2018 Document Reviewed: 04/23/2017 Elsevier Patient Education  2020 [employer].

## 2019-05-27 NOTE — Telephone Encounter (Signed)
I called the patient. The Maxalt is working well, but she is concerned she may run out of her medication, she has 5 left by the end of the month. We stopped the nortriptyline at last visit due to side effect. She feels much better, much more alert, clear mind since off. Had 1st Aimovig injection on the end of the month. Remains on topamax. It is best to not take too many triptan due to risk for rebound headache. If continues to be problem, I can send in a few more to get to the end of the month, could try Ubrelvy or Nurtec, or start Effexor, or propranolol.

## 2019-05-27 NOTE — Telephone Encounter (Signed)
Pt called needing to speak to Provider or RN about her rizatriptan (MAXALT) 10 MG tablet Please advise.

## 2019-05-28 ENCOUNTER — Telehealth: Payer: Self-pay | Admitting: Neurology

## 2019-05-28 MED ORDER — VENLAFAXINE HCL ER 37.5 MG PO CP24: 37.5000 mg | ORAL_CAPSULE | Freq: Every day | ORAL | 5 refills | Status: DC

## 2019-05-28 NOTE — Telephone Encounter (Signed)
I called the patient. I will send in Effexor 37.5 mg daily for migraine prevention while she is adjusting to Aimovig. She will continue Topamax 100 mg at bedtime. Maxalt, tizanidine, zofran as needed.

## 2019-06-02 ENCOUNTER — Other Ambulatory Visit: Payer: Self-pay

## 2019-06-02 ENCOUNTER — Ambulatory Visit (INDEPENDENT_AMBULATORY_CARE_PROVIDER_SITE_OTHER): Payer: BC Managed Care – PPO

## 2019-06-02 DIAGNOSIS — S81801A Unspecified open wound, right lower leg, initial encounter: Secondary | ICD-10-CM | POA: Diagnosis not present

## 2019-06-02 DIAGNOSIS — R0989 Other specified symptoms and signs involving the circulatory and respiratory systems: Secondary | ICD-10-CM

## 2019-06-12 ENCOUNTER — Other Ambulatory Visit: Payer: Self-pay | Admitting: Neurology

## 2019-06-17 ENCOUNTER — Telehealth: Payer: Self-pay | Admitting: Neurology

## 2019-06-17 NOTE — Telephone Encounter (Signed)
Pt called stating that her pharmacy [facility] has been trying to get her rizatriptan (MAXALT) 10 MG tablet approved for a refill and she is wanting to know what the update is on that. Please advise.

## 2019-06-17 NOTE — Telephone Encounter (Signed)
I called pharmacy and they had prescription on hold, but it did go thru when she ran it thru insurance.  They will get ready for pt.  I called pt and let her know.

## 2019-07-21 ENCOUNTER — Other Ambulatory Visit: Payer: Self-pay | Admitting: Neurology

## 2019-07-21 NOTE — Telephone Encounter (Signed)
Pending appt 08/10/2019.

## 2019-08-10 ENCOUNTER — Encounter: Payer: Self-pay | Admitting: Neurology

## 2019-08-10 ENCOUNTER — Telehealth: Payer: Self-pay

## 2019-08-10 ENCOUNTER — Ambulatory Visit: Payer: BC Managed Care – PPO | Admitting: Neurology

## 2019-08-10 ENCOUNTER — Other Ambulatory Visit: Payer: Self-pay

## 2019-08-10 VITALS — BP 125/85 | HR 80 | Temp 97.9°F | Ht 69.0 in | Wt 248.8 lb

## 2019-08-10 DIAGNOSIS — G43709 Chronic migraine without aura, not intractable, without status migrainosus: Secondary | ICD-10-CM | POA: Diagnosis not present

## 2019-08-10 DIAGNOSIS — IMO0002 Reserved for concepts with insufficient information to code with codable children: Secondary | ICD-10-CM

## 2019-08-10 MED ORDER — AIMOVIG 140 MG/ML ~~LOC~~ SOAJ: 140.0000 mg | SUBCUTANEOUS | 11 refills | Status: DC

## 2019-08-10 MED ORDER — VENLAFAXINE HCL ER 37.5 MG PO CP24: 37.5000 mg | ORAL_CAPSULE | Freq: Every day | ORAL | 11 refills | Status: DC

## 2019-08-10 MED ORDER — RIZATRIPTAN BENZOATE 10 MG PO TABS: ORAL_TABLET | ORAL | 11 refills | Status: DC
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
## 2019-08-10 NOTE — Telephone Encounter (Signed)
Started a PA for NP SS pt on CMM. For Medication AIMOVIG 140mg /ML.  Awaiting Response.  Key: [id number]

## 2019-08-10 NOTE — Patient Instructions (Signed)
Increase Aimovig 140 mg every month, continue topamax, effexor, maxalt as needed  Try to take eye breaks when using the computer   Return in 4 months with Dr. Krista Blue

## 2019-08-11 NOTE — Telephone Encounter (Signed)
Received a approval for Aimovig. Approved through 08-09-2020. A copy of the approval letter has been faxed to the patient's pharmacy. Confirmation fax has been received.

## 2019-08-24 MED ORDER — TRIAMTERENE-HCTZ 37.5-25 MG PO CAPS: 1.0000 | ORAL_CAPSULE | Freq: Every day | ORAL | 1 refills | Status: DC

## 2019-09-16 ENCOUNTER — Other Ambulatory Visit: Payer: Self-pay | Admitting: Internal Medicine

## 2019-09-16 DIAGNOSIS — Z1231 Encounter for screening mammogram for malignant neoplasm of breast: Secondary | ICD-10-CM

## 2019-11-27 ENCOUNTER — Other Ambulatory Visit: Payer: Self-pay

## 2019-11-27 ENCOUNTER — Encounter: Payer: Self-pay | Admitting: Internal Medicine

## 2019-11-27 ENCOUNTER — Ambulatory Visit (INDEPENDENT_AMBULATORY_CARE_PROVIDER_SITE_OTHER): Payer: BC Managed Care – PPO | Admitting: Internal Medicine

## 2019-11-27 VITALS — BP 118/80 | HR 101 | Ht 67.0 in | Wt 242.0 lb

## 2019-11-27 DIAGNOSIS — I10 Essential (primary) hypertension: Secondary | ICD-10-CM

## 2019-11-27 DIAGNOSIS — R0600 Dyspnea, unspecified: Secondary | ICD-10-CM | POA: Diagnosis not present

## 2019-11-27 DIAGNOSIS — R0609 Other forms of dyspnea: Secondary | ICD-10-CM

## 2019-11-27 DIAGNOSIS — E119 Type 2 diabetes mellitus without complications: Secondary | ICD-10-CM | POA: Diagnosis not present

## 2019-11-27 DIAGNOSIS — Z8616 Personal history of COVID-19: Secondary | ICD-10-CM

## 2019-11-27 NOTE — Patient Instructions (Signed)
Medication Instructions:  Your physician recommends that you continue on your current medications as directed. Please refer to the Current Medication list given to you today.  *If you need a refill on your cardiac medications before your next appointment, please call your pharmacy*   Lab Work: None ordered If you have labs (blood work) drawn today and your tests are completely normal, you will receive your results only by: Marland Kitchen MyChart Message (if you have MyChart) OR . A paper copy in the mail If you have any lab test that is abnormal or we need to change your treatment, we will call you to review the results.   Testing/Procedures: Your physician has requested that you have an echocardiogram. Echocardiography is a painless test that uses sound waves to create images of your heart. It provides your doctor with information about the size and shape of your heart and how well your heart's chambers and valves are working. This procedure takes approximately one hour. There are no restrictions for this procedure.     Follow-Up: At [facility], you and your health needs are our priority.  As part of our continuing mission to provide you with exceptional heart care, we have created designated Provider Care Teams.  These Care Teams include your primary Cardiologist (physician) and Advanced Practice Providers (APPs -  Physician Assistants and Nurse Practitioners) who all work together to provide you with the care you need, when you need it.  We recommend signing up for the patient portal called "MyChart".  Sign up information is provided on this After Visit Summary.  MyChart is used to connect with patients for Virtual Visits (Telemedicine).  Patients are able to view lab/test results, encounter notes, upcoming appointments, etc.  Non-urgent messages can be sent to your provider as well.   To learn more about what you can do with MyChart, go to [email or web address].    Your next appointment:   3  month(s)  The format for your next appointment:   In Person  Provider:    You may see one of the following Advanced Practice Providers on your designated Care Team:    Murray Hodgkins, NP  Christell Faith, PA-C  Marrianne Mood, PA-C    Other Instructions  Echocardiogram An echocardiogram is a procedure that uses painless sound waves (ultrasound) to produce an image of the heart. Images from an echocardiogram can provide important information about:  Signs of coronary artery disease (CAD).  Aneurysm detection. An aneurysm is a weak or damaged part of an artery wall that bulges out from the normal force of blood pumping through the body.  Heart size and shape. Changes in the size or shape of the heart can be associated with certain conditions, including heart failure, aneurysm, and CAD.  Heart muscle function.  Heart valve function.  Signs of a past heart attack.  Fluid buildup around the heart.  Thickening of the heart muscle.  A tumor or infectious growth around the heart valves. Tell a health care provider about:  Any allergies you have.  All medicines you are taking, including vitamins, herbs, eye drops, creams, and over-the-counter medicines.  Any blood disorders you have.  Any surgeries you have had.  Any medical conditions you have.  Whether you are pregnant or may be pregnant. What are the risks? Generally, this is a safe procedure. However, problems may occur, including:  Allergic reaction to dye (contrast) that may be used during the procedure. What happens before the procedure? No specific preparation is needed.  You may eat and drink normally. What happens during the procedure?   An IV tube may be inserted into one of your veins.  You may receive contrast through this tube. A contrast is an injection that improves the quality of the pictures from your heart.  A gel will be applied to your chest.  A wand-like tool (transducer) will be moved  over your chest. The gel will help to transmit the sound waves from the transducer.  The sound waves will harmlessly bounce off of your heart to allow the heart images to be captured in real-time motion. The images will be recorded on a computer. The procedure may vary among health care providers and hospitals. What happens after the procedure?  You may return to your normal, everyday life, including diet, activities, and medicines, unless your health care provider tells you not to do that. Summary  An echocardiogram is a procedure that uses painless sound waves (ultrasound) to produce an image of the heart.  Images from an echocardiogram can provide important information about the size and shape of your heart, heart muscle function, heart valve function, and fluid buildup around your heart.  You do not need to do anything to prepare before this procedure. You may eat and drink normally.  After the echocardiogram is completed, you may return to your normal, everyday life, unless your health care provider tells you not to do that. This information is not intended to replace advice given to you by your health care provider. Make sure you discuss any questions you have with your health care provider. Document Revised: 11/20/2018 Document Reviewed: 09/01/2016 Elsevier Patient Education  [employer].

## 2019-11-27 NOTE — Progress Notes (Signed)
Follow-up Outpatient Visit Date: 11/27/2019  Primary Care Provider: Idelle Crouch, MD [place] [id number]  Chief Complaint: Fatigue and shortness of breath  HPI:  Zoe Daniels is a 44 y.o. female with history of hypertension, hyperlipidemia, type 2 diabetes mellitus, and mitral valve disease, who presents for follow-up of hypertension.  I last saw her in October, at which time she was feeling well with better blood pressure control.  Due to right foot abrasions that have been slow to heal as well as diminished pedal pulses, ABIs were ordered.  These were normal, though right TBI was mildly diminished.  She contracted COVID-19 in March, with symptoms beginning the day after her first COVID-19 vaccine.  Today, [name] reports that she is still very fatigued.  Her initial COVID-19 symptoms were fevers lasting about a week.  She subsequently developed cough with significant mucus production.  She has had persistent exertional dyspnea and generalized fatigue since then.  She also has experienced a recrudescence of her headaches.  She denies chest pain, orthopnea, palpitations, and lightheadedness.  She notes minimal swelling in her legs from time to time.  Before contracting COVID-19, she was feeling relatively well and reports that her blood pressures were well controlled.  --------------------------------------------------------------------------------------------------  Cardiovascular History & Procedures: Cardiovascular Problems:  Hypertension.  Palpitations.  Dyspnea on exertion.  Risk Factors:  Hypertension, hyperlipidemia, type 2 diabetes mellitus, and obesity  Cath/PCI:  None  CV Surgery:  None  EP Procedures and Devices:  None  Non-Invasive Evaluation(s):  ABIs (06/02/2019): Normal ABIs (1.06 on the right and 1.02 on the left.  TBI's normal on the left (0.7 and mildly abnormal on the right (0.45).  Exercise stress  echo (05/14/2016, [facility]): Normal LV function at baseline with hyperkinetic response to stress. No evidence of ischemia.  Recent CV Pertinent Labs: Lab Results  Component Value Date   K 3.5 05/13/2019   BUN 16 05/13/2019   BUN 10 04/29/2019   CREATININE 0.75 05/13/2019    Past medical and surgical history were reviewed and updated in EPIC.  Current Meds  Medication Sig  . Erenumab-aooe (AIMOVIG) 140 MG/ML SOAJ Inject 140 mg into the skin every 30 (thirty) days.  Marland Kitchen gabapentin (NEURONTIN) 300 MG capsule 300 mg 2 (two) times daily.  Marland Kitchen glimepiride (AMARYL) 4 MG tablet Take by mouth.  Marland Kitchen HYDROcodone-acetaminophen (NORCO/VICODIN) 5-325 MG tablet TAKE 1/2 TO 1 TABLET TWICE A DAY AS NEEDED  . lisinopril (ZESTRIL) 20 MG tablet Take 1 tablet (20 mg total) by mouth daily.  . ondansetron (ZOFRAN ODT) 4 MG disintegrating tablet Take 1 tablet (4 mg total) by mouth every 8 (eight) hours as needed.  . rizatriptan (MAXALT) 10 MG tablet TAKE 1 TABLET BY MOUTH AS NEEDED FOR MIGRAINE. MAY REPEAT IN 2 HOURS IF NEEDED.  Marland Kitchen tiZANidine (ZANAFLEX) 4 MG capsule Take 8 mg by mouth as needed.   . topiramate (TOPAMAX) 100 MG tablet Take 1 tablet (100 mg total) by mouth 2 (two) times daily. (Patient taking differently: Take 100 mg by mouth. Taking 1x daily)  . triamterene-hydrochlorothiazide (DYAZIDE) 37.5-25 MG capsule Take 1 each (1 capsule total) by mouth daily.  Marland Kitchen venlafaxine XR (EFFEXOR XR) 37.5 MG 24 hr capsule Take 1 capsule (37.5 mg total) by mouth at bedtime.    Allergies: Prednisone  Social History   Tobacco Use  . Smoking status: Never Smoker  . Smokeless tobacco: Never Used  Substance Use Topics  . Alcohol  use: No  . Drug use: No    Family History  Problem Relation Age of Onset  . Alcoholism Mother   . Arthritis Mother   . Glaucoma Mother   . Hypertension Father   . Stroke Father   . Heart attack Paternal Grandfather     Review of Systems: A 12-system review of systems was  performed and was negative except as noted in the HPI.  --------------------------------------------------------------------------------------------------  Physical Exam: BP 118/80 (BP Location: Left Arm, Patient Position: Sitting, Cuff Size: Normal)   Pulse (!) 101   Ht 5\' 7"  (1.702 m)   Wt 242 lb (109.8 kg)   SpO2 98%   BMI 37.90 kg/m   General: NAD. HEENT: No conjunctival pallor or scleral icterus. Facemask in place. Neck: No JVD or HJR. Lungs: Normal work of breathing. Clear to auscultation bilaterally without wheezes or crackles. Heart: Tachycardic but regular with 2/6 holosystolic murmur.  No rubs or gallops. Abd: Bowel sounds present. Soft, NT/ND. Ext: No lower extremity edema.  EKG: Sinus tachycardia (heart rate 101 bpm).  Possible left atrial enlargement.  Otherwise, no significant abnormality.  Lab Results  Component Value Date   WBC 13.4 (H) 04/13/2016   HGB 15.9 04/13/2016   HCT 47.5 (H) 04/13/2016   MCV 80.6 04/13/2016   PLT 223 04/13/2016    Lab Results  Component Value Date   NA 134 (L) 05/13/2019   K 3.5 05/13/2019   CL 97 (L) 05/13/2019   CO2 23 05/13/2019   BUN 16 05/13/2019   CREATININE 0.75 05/13/2019   GLUCOSE 212 (H) 05/13/2019    No results found for: CHOL, HDL, LDLCALC, LDLDIRECT, TRIG, CHOLHDL  --------------------------------------------------------------------------------------------------  ASSESSMENT AND PLAN: Dyspnea on exertion and recent COVID-19 infection: I suspect persistent fatigue and dyspnea are lingering effects of COVID-19 infection, which will hopefully improve with conservative measures.  However, given mild tachycardia today as well as 2/6 systolic murmur on examination today, I think it would be prudent for [place] to obtain a transthoracic echocardiogram to exclude underlying cardiomyopathy.  Hypertension: Blood pressure well controlled today.  No medication changes at this time.  Type 2 diabetes mellitus: Diagnosis was  recently made.  Recommend continued close follow-up with Dr. Doy Hutching.  Follow-up: Return to clinic in 3 months, sooner if significant abnormality identified on echocardiogram.  Nelva Bush, MD 11/27/2019 8:46 AM

## 2019-11-30 ENCOUNTER — Ambulatory Visit
Admission: RE | Admit: 2019-11-30 | Discharge: 2019-11-30 | Disposition: A | Discharge status: Home / Self Care | Payer: BC Managed Care – PPO | Source: Ambulatory Visit | Attending: Internal Medicine | Admitting: Internal Medicine

## 2019-11-30 DIAGNOSIS — Z1231 Encounter for screening mammogram for malignant neoplasm of breast: Secondary | ICD-10-CM | POA: Insufficient documentation

## 2019-12-09 ENCOUNTER — Encounter: Payer: Self-pay | Admitting: Neurology

## 2019-12-09 ENCOUNTER — Ambulatory Visit: Payer: BC Managed Care – PPO | Admitting: Neurology

## 2019-12-09 ENCOUNTER — Other Ambulatory Visit: Payer: Self-pay

## 2019-12-09 VITALS — BP 112/71 | HR 84 | Temp 97.9°F | Ht 67.0 in | Wt 247.0 lb

## 2019-12-09 DIAGNOSIS — M545 Low back pain: Secondary | ICD-10-CM

## 2019-12-09 DIAGNOSIS — IMO0002 Reserved for concepts with insufficient information to code with codable children: Secondary | ICD-10-CM

## 2019-12-09 DIAGNOSIS — G8929 Other chronic pain: Secondary | ICD-10-CM | POA: Diagnosis not present

## 2019-12-09 DIAGNOSIS — G43709 Chronic migraine without aura, not intractable, without status migrainosus: Secondary | ICD-10-CM

## 2019-12-09 MED ORDER — RIZATRIPTAN BENZOATE 10 MG PO TABS: ORAL_TABLET | ORAL | 11 refills | Status: AC

## 2019-12-09 MED ORDER — VENLAFAXINE HCL ER 75 MG PO CP24: 75.0000 mg | ORAL_CAPSULE | Freq: Every day | ORAL | 11 refills | Status: AC
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
## 2019-12-23 ENCOUNTER — Other Ambulatory Visit: Payer: Self-pay | Admitting: Neurology

## 2020-01-06 ENCOUNTER — Other Ambulatory Visit: Payer: Self-pay | Admitting: Neurology

## 2020-01-06 ENCOUNTER — Other Ambulatory Visit: Payer: Self-pay

## 2020-01-06 ENCOUNTER — Ambulatory Visit (INDEPENDENT_AMBULATORY_CARE_PROVIDER_SITE_OTHER): Payer: BC Managed Care – PPO

## 2020-01-06 DIAGNOSIS — R0600 Dyspnea, unspecified: Secondary | ICD-10-CM | POA: Diagnosis not present

## 2020-01-06 DIAGNOSIS — R0609 Other forms of dyspnea: Secondary | ICD-10-CM

## 2020-01-06 DIAGNOSIS — Z8616 Personal history of COVID-19: Secondary | ICD-10-CM

## 2020-01-06 MED ORDER — PERFLUTREN LIPID MICROSPHERE
1.0000 mL | INTRAVENOUS | Status: AC | PRN
  Administered 2020-01-06: 2 mL via INTRAVENOUS

## 2020-01-08 ENCOUNTER — Telehealth: Payer: Self-pay

## 2020-01-08 NOTE — Telephone Encounter (Signed)
-----   Message from Nelva Bush, MD sent at 01/08/2020 12:47 PM EDT ----- Please let Ms. Brotman know that her echocardiogram is normal.  I do not recommend further testing at this time.  Ms. Merklinger should follow-up as previously discussed.

## 2020-01-08 NOTE — Telephone Encounter (Signed)
Call to patient to review echo results.    Pt verbalized understanding and has no further questions at this time.    Advised pt to call for any further questions or concerns.  No further orders.   Confirmed July appt.

## 2020-01-15 MED ORDER — TRIAMTERENE-HCTZ 37.5-25 MG PO CAPS: 1.0000 | ORAL_CAPSULE | Freq: Every day | ORAL | 1 refills | Status: DC

## 2020-01-16 ENCOUNTER — Other Ambulatory Visit: Payer: Self-pay | Admitting: Internal Medicine

## 2020-02-16 ENCOUNTER — Encounter: Payer: Self-pay | Admitting: Dietician

## 2020-02-16 ENCOUNTER — Encounter: Payer: BC Managed Care – PPO | Attending: Surgery | Admitting: Dietician

## 2020-02-16 ENCOUNTER — Other Ambulatory Visit: Payer: Self-pay

## 2020-02-16 VITALS — Ht 64.0 in | Wt 244.3 lb

## 2020-02-16 DIAGNOSIS — E119 Type 2 diabetes mellitus without complications: Secondary | ICD-10-CM | POA: Diagnosis not present
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
## 2020-02-16 NOTE — Patient Instructions (Signed)
·   Plan ahead to prepare balanced meals. Try apps such at MySugr, Fooducate, or others to help.   When choosing frozen meals, check the label to aim for 30-45grams carbs (if less, you can add an extra carb such as fruit). Keep fat to 10-15grams per meal on average.   Continue to make healthy food choices, great job!  Include light activity as able -- swimming, light walking, etc.

## 2020-03-01 ENCOUNTER — Ambulatory Visit: Payer: BC Managed Care – PPO | Admitting: Nurse Practitioner

## 2020-03-09 ENCOUNTER — Encounter: Payer: Self-pay | Admitting: Physician Assistant

## 2020-03-09 ENCOUNTER — Ambulatory Visit: Payer: BC Managed Care – PPO | Admitting: Physician Assistant

## 2020-03-09 ENCOUNTER — Other Ambulatory Visit: Payer: Self-pay

## 2020-03-09 VITALS — BP 110/80 | HR 78 | Ht 64.0 in | Wt 244.8 lb

## 2020-03-09 DIAGNOSIS — Z8616 Personal history of COVID-19: Secondary | ICD-10-CM | POA: Diagnosis not present

## 2020-03-09 DIAGNOSIS — G43709 Chronic migraine without aura, not intractable, without status migrainosus: Secondary | ICD-10-CM

## 2020-03-09 DIAGNOSIS — H8109 Meniere's disease, unspecified ear: Secondary | ICD-10-CM

## 2020-03-09 DIAGNOSIS — IMO0002 Reserved for concepts with insufficient information to code with codable children: Secondary | ICD-10-CM

## 2020-03-09 DIAGNOSIS — E119 Type 2 diabetes mellitus without complications: Secondary | ICD-10-CM

## 2020-03-09 DIAGNOSIS — I059 Rheumatic mitral valve disease, unspecified: Secondary | ICD-10-CM

## 2020-03-09 DIAGNOSIS — R0609 Other forms of dyspnea: Secondary | ICD-10-CM

## 2020-03-09 DIAGNOSIS — R5383 Other fatigue: Secondary | ICD-10-CM | POA: Diagnosis not present

## 2020-03-09 DIAGNOSIS — R0600 Dyspnea, unspecified: Secondary | ICD-10-CM

## 2020-03-09 DIAGNOSIS — I10 Essential (primary) hypertension: Secondary | ICD-10-CM | POA: Diagnosis not present

## 2020-03-09 DIAGNOSIS — R42 Dizziness and giddiness: Secondary | ICD-10-CM

## 2020-03-09 DIAGNOSIS — R0989 Other specified symptoms and signs involving the circulatory and respiratory systems: Secondary | ICD-10-CM

## 2020-03-09 NOTE — Patient Instructions (Signed)
Medication Instructions:  °Your physician recommends that you continue on your current medications as directed. Please refer to the Current Medication list given to you today. ° °*If you need a refill on your cardiac medications before your next appointment, please call your pharmacy* ° ° °Lab Work: °None ordered  °If you have labs (blood work) drawn today and your tests are completely normal, you will receive your results only by: °• MyChart Message (if you have MyChart) OR °• A paper copy in the mail °If you have any lab test that is abnormal or we need to change your treatment, we will call you to review the results. ° ° °Testing/Procedures: °None ordered  ° ° °Follow-Up: °At [facility], you and your health needs are our priority.  As part of our continuing mission to provide you with exceptional heart care, we have created designated Provider Care Teams.  These Care Teams include your primary Cardiologist (physician) and Advanced Practice Providers (APPs -  Physician Assistants and Nurse Practitioners) who all work together to provide you with the care you need, when you need it. ° °We recommend signing up for the patient portal called "MyChart".  Sign up information is provided on this After Visit Summary.  MyChart is used to connect with patients for Virtual Visits (Telemedicine).  Patients are able to view lab/test results, encounter notes, upcoming appointments, etc.  Non-urgent messages can be sent to your provider as well.   °To learn more about what you can do with MyChart, go to [email or web address].   ° °Your next appointment:   °As needed °
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
## 2020-06-09 ENCOUNTER — Ambulatory Visit: Payer: BC Managed Care – PPO | Admitting: Neurology

## 2020-07-18 ENCOUNTER — Other Ambulatory Visit: Payer: Self-pay | Admitting: Internal Medicine

## 2020-09-29 ENCOUNTER — Other Ambulatory Visit: Payer: Self-pay | Admitting: Internal Medicine

## 2020-09-29 DIAGNOSIS — Z1231 Encounter for screening mammogram for malignant neoplasm of breast: Secondary | ICD-10-CM

## 2020-10-17 ENCOUNTER — Other Ambulatory Visit: Payer: Self-pay | Admitting: Internal Medicine

## 2020-11-04 ENCOUNTER — Other Ambulatory Visit: Payer: Self-pay | Admitting: Neurology

## 2021-06-17 IMAGING — MG DIGITAL SCREENING BILAT W/ TOMO W/ CAD
6 of 10 series · 6 of 30 positions shown · non-contrast
Comparison: None.

CLINICAL DATA: Screening.

EXAM:
DIGITAL SCREENING BILATERAL MAMMOGRAM WITH TOMO AND CAD

[L XCCL synth-2D]
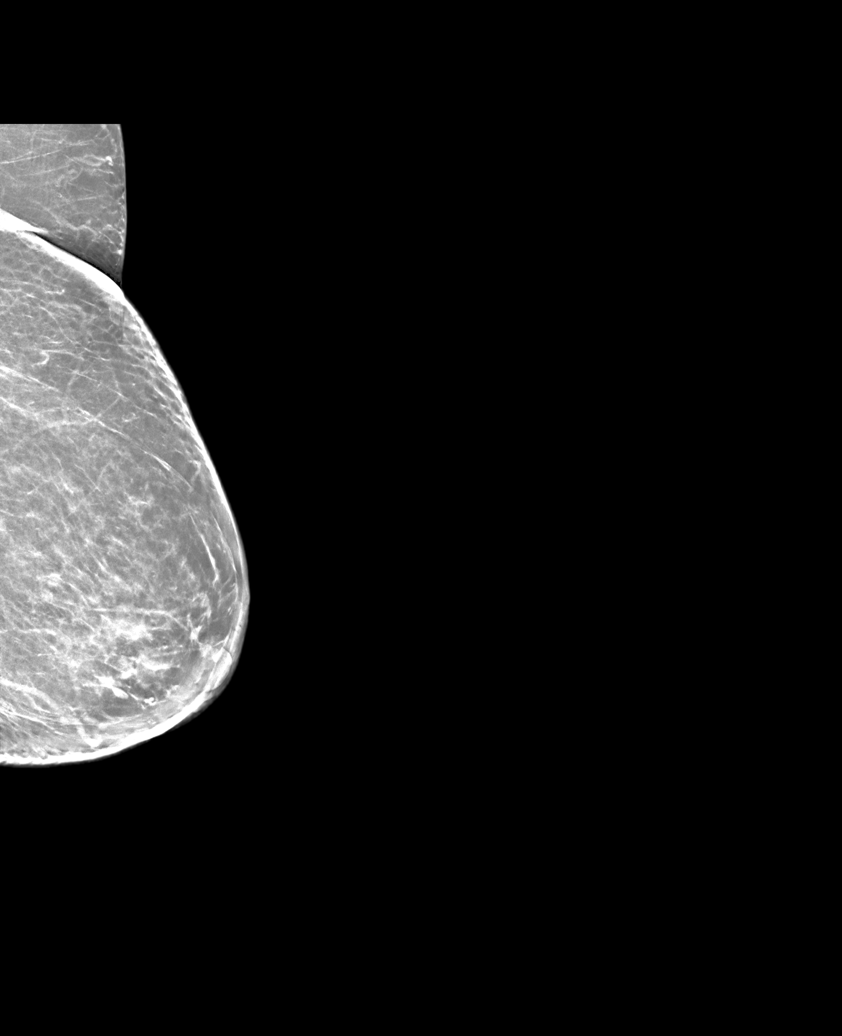

[L MLO synth-2D]
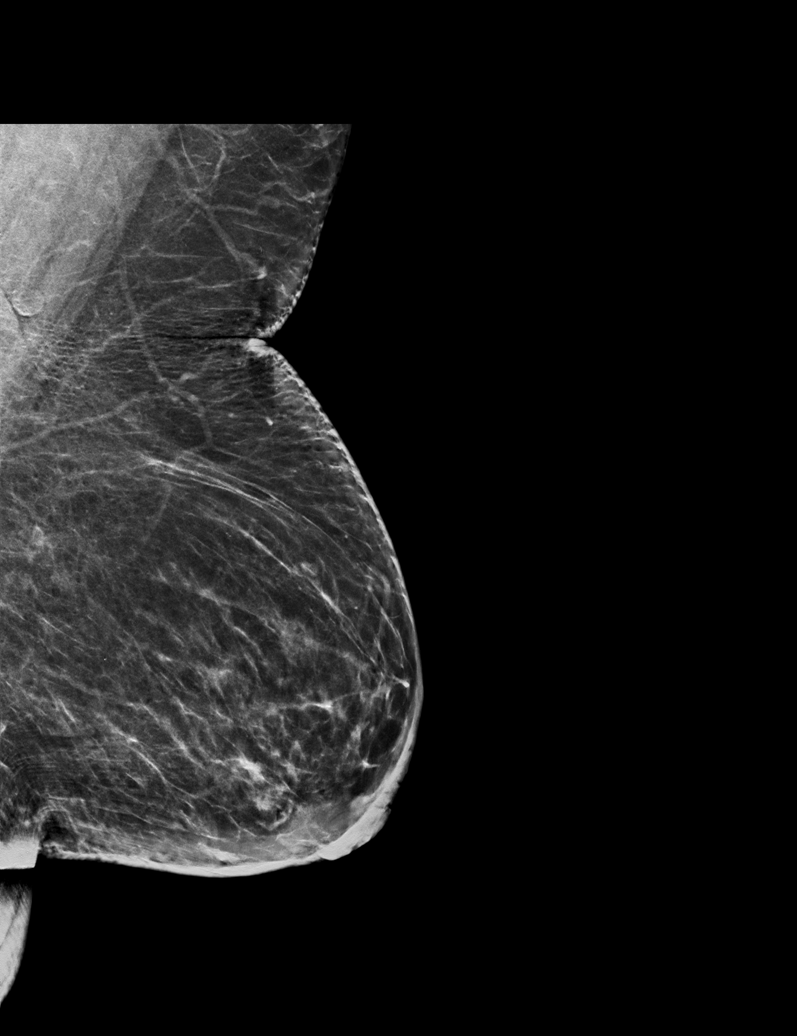

[L CC synth-2D]
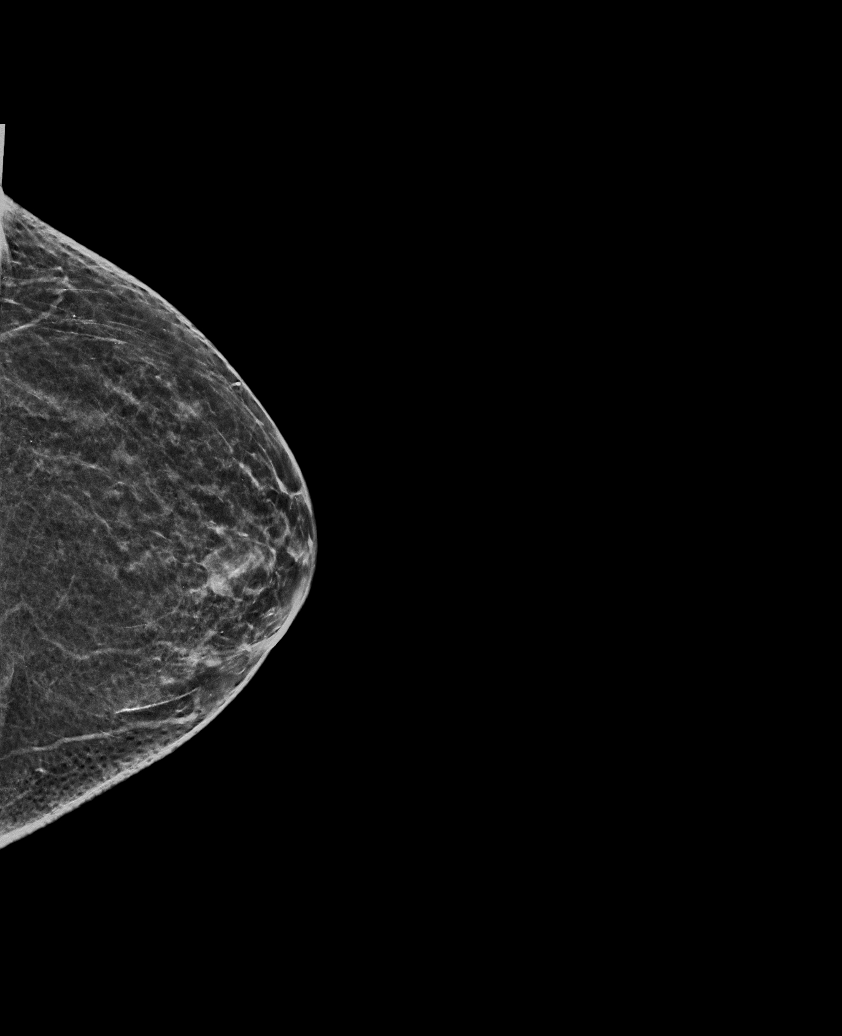

[R MLO synth-2D]
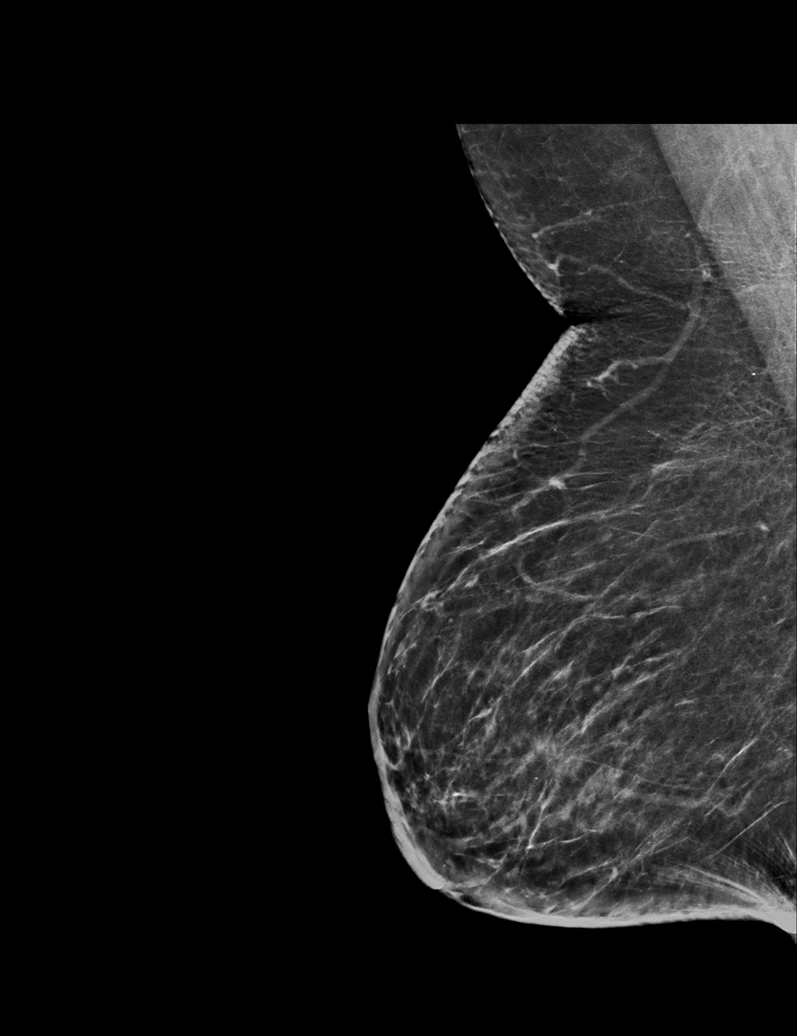

[R CC synth-2D]
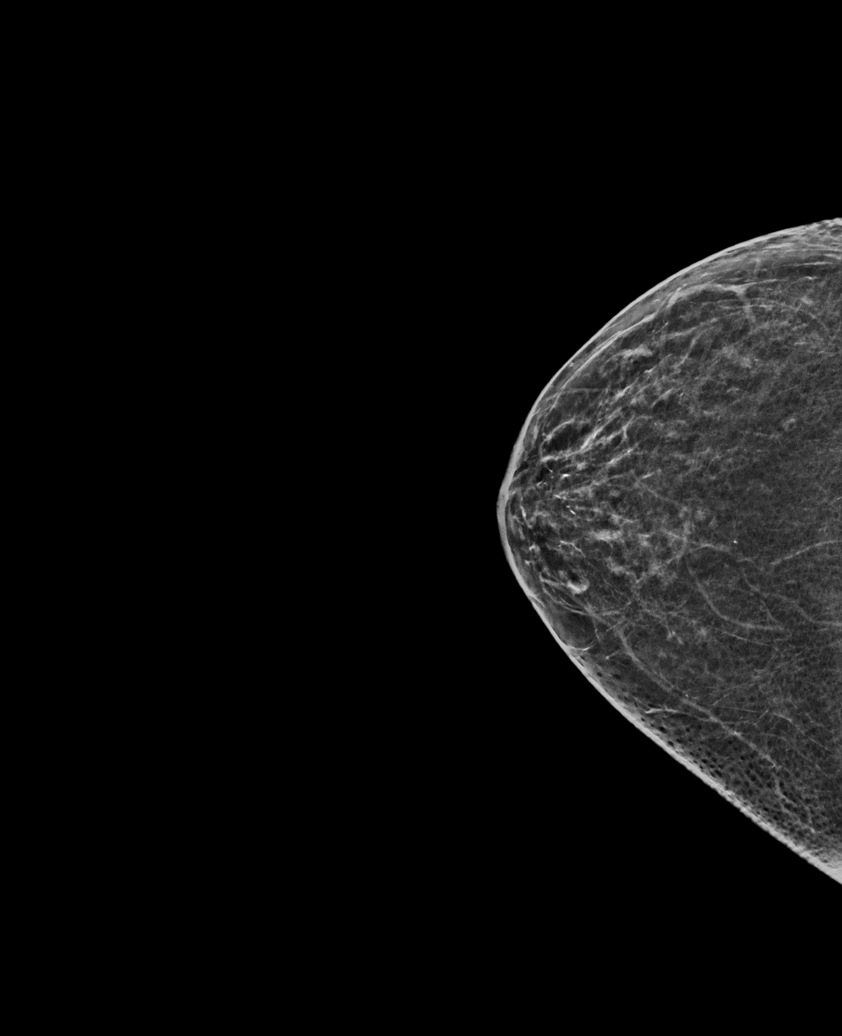

[R CC tomo · tomo slice 27/53.0]
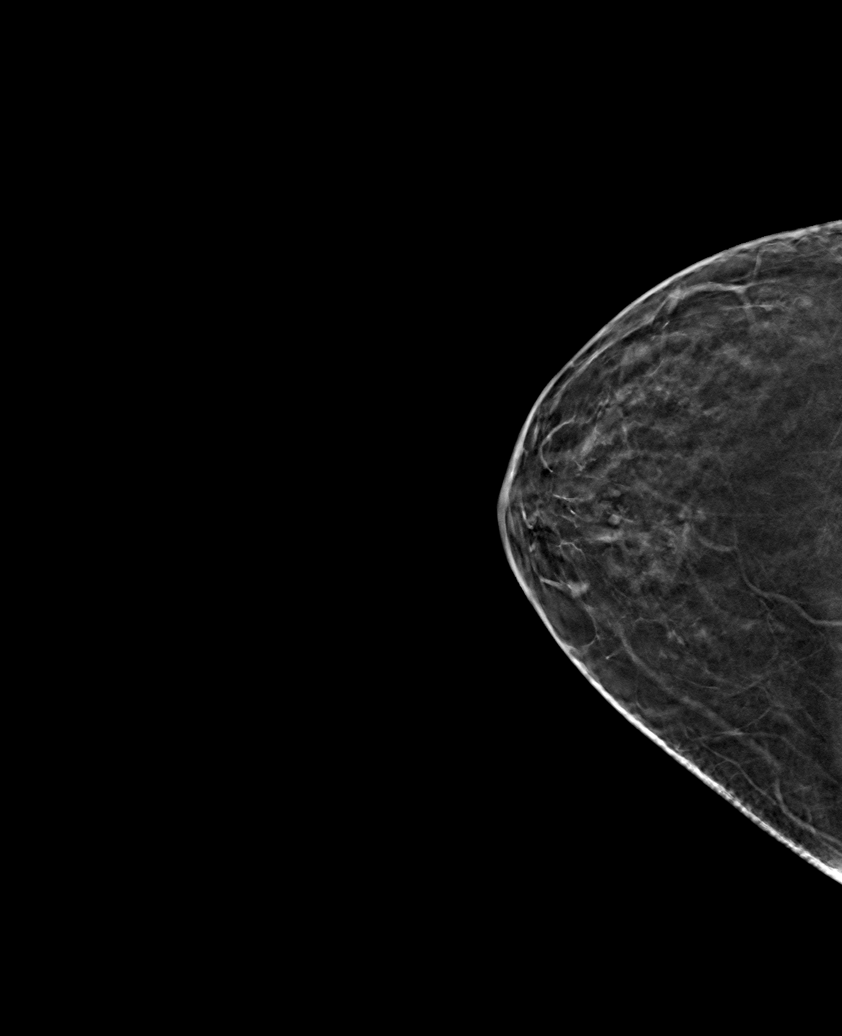

[6 of 30 positions shown; findings below may reference images not displayed]

ACR Breast Density Category b: There are scattered areas of
fibroglandular density.
FINDINGS: There are no findings suspicious for malignancy. Images were
processed with CAD.
IMPRESSION: No mammographic evidence of malignancy. A result letter of this
screening mammogram will be mailed directly to the patient.

RECOMMENDATION:
Screening mammogram in one year. (Code:[id number])

BI-RADS CATEGORY  1: Negative.

## 2021-07-10 ENCOUNTER — Other Ambulatory Visit: Payer: Self-pay | Admitting: Internal Medicine

## 2021-07-10 NOTE — Telephone Encounter (Signed)
Please schedule F/U appointment for refills unless patient prefers to get med through PCP. Thank you!

## 2021-07-11 NOTE — Telephone Encounter (Signed)
LVM to schedule appt

## 2021-08-31 NOTE — Telephone Encounter (Signed)
Attempted to schedule.  LMOV to call office.  ° °

## 2021-09-01 NOTE — Telephone Encounter (Signed)
Attempted to schedule.  

## 2021-09-01 NOTE — Telephone Encounter (Signed)
Scheduled

## 2021-10-11 ENCOUNTER — Other Ambulatory Visit: Payer: Self-pay

## 2021-10-11 ENCOUNTER — Ambulatory Visit: Payer: BC Managed Care – PPO | Admitting: Nurse Practitioner

## 2021-10-11 ENCOUNTER — Encounter: Payer: Self-pay | Admitting: Nurse Practitioner

## 2021-10-11 VITALS — BP 100/62 | HR 79 | Ht 67.0 in | Wt 234.0 lb

## 2021-10-11 DIAGNOSIS — I059 Rheumatic mitral valve disease, unspecified: Secondary | ICD-10-CM

## 2021-10-11 DIAGNOSIS — E119 Type 2 diabetes mellitus without complications: Secondary | ICD-10-CM

## 2021-10-11 DIAGNOSIS — I10 Essential (primary) hypertension: Secondary | ICD-10-CM | POA: Diagnosis not present

## 2021-10-11 DIAGNOSIS — E782 Mixed hyperlipidemia: Secondary | ICD-10-CM | POA: Diagnosis not present

## 2021-10-11 NOTE — Patient Instructions (Signed)
Medication Instructions:  ?Your physician recommends that you continue on your current medications as directed. Please refer to the Current Medication list given to you today. ? ?*If you need a refill on your cardiac medications before your next appointment, please call your pharmacy* ? ? ?Lab Work: ?None ordered ?If you have labs (blood work) drawn today and your tests are completely normal, you will receive your results only by: ?MyChart Message (if you have MyChart) OR ?A paper copy in the mail ?If you have any lab test that is abnormal or we need to change your treatment, we will call you to review the results. ? ? ?Testing/Procedures: ?None ordered ? ? ?Follow-Up: ?At [facility], you and your health needs are our priority.  As part of our continuing mission to provide you with exceptional heart care, we have created designated Provider Care Teams.  These Care Teams include your primary Cardiologist (physician) and Advanced Practice Providers (APPs -  Physician Assistants and Nurse Practitioners) who all work together to provide you with the care you need, when you need it. ? ?We recommend signing up for the patient portal called "MyChart".  Sign up information is provided on this After Visit Summary.  MyChart is used to connect with patients for Virtual Visits (Telemedicine).  Patients are able to view lab/test results, encounter notes, upcoming appointments, etc.  Non-urgent messages can be sent to your provider as well.   ?To learn more about what you can do with MyChart, go to [email or web address].   ? ?Your next appointment:   ?As needed ? ?The format for your next appointment:   ?In Person ? ?Provider:   ?You may see Nelva Bush, MD or one of the following Advanced Practice Providers on your designated Care Team:   ?Murray Hodgkins, NP ?Christell Faith, PA-C ?Cadence Kathlen Mody, PA-C  ? ? ?Other Instructions ?N/A ? ?

## 2021-10-11 NOTE — Progress Notes (Signed)
? ? ?Office Visit  ?  ?Patient Name: Zoe Daniels ?Date of Encounter: 10/11/2021 ? ?Primary Care Provider:  Idelle Crouch, MD ?Primary Cardiologist:  Nelva Bush, MD ? ?Chief Complaint  ?  ?46 year old female with a history of hypertension, hyperlipidemia, diabetes, obesity, migraine headaches, M?ni?re's disease, and mitral valve disease, who presents for follow-up related to hypertension. ? ?Past Medical History  ?  ?Past Medical History:  ?Diagnosis Date  ? "Mitral valve disorder"   ? a. Reported h/o unspecificed mitral valve disorder; b. 12/2019 Echo: EF 65-70%, no rwma, nl RV fxn, no significant valvular disease.  ? Back pain   ? Basal cell carcinoma   ? skin  ? DDD (degenerative disc disease), lumbar   ? Headache   ? Hypertension   ? Lumbar herniated disc   ? Sudden idiopathic hearing loss   ? ?Past Surgical History:  ?Procedure Laterality Date  ? lap band  2009  ? ? ?Allergies ? ?Allergies  ?Allergen Reactions  ? Prednisone Other (See Comments)  ?  Don't feel well, lowers immunity  ? ? ?History of Present Illness  ?  ?46 year old female with above past medical history including hypertension, hyperlipidemia, diabetes, obesity, migraine headaches, M?ni?re's disease, and mitral valve disease.  She was previously followed by Dr. Nehemiah Daniels in the setting of syncope and hypertension.  Stress echocardiogram in October 2017 showed normal LV function with hyper kinetic response to stress, and no evidence of ischemia.  She established care with Dr. Saunders Daniels in September 2020.  In the setting of persistent fatigue and mild tachycardia following COVID infection in the spring 2021, as well as a chronic systolic murmur, echocardiogram was performed in May 2021 and showed an EF of 65 to 70% without any wall motion abnormalities or significant valvular disease. ? ?Zoe Daniels was last seen in cardiology clinic in July 2021, at which time she was doing well from a cardiac standpoint.  She was struggling with M?ni?re's and  chronic pain.  Since her last visit, Zoe Daniels has done well.  She occasionally has vertiginous symptoms often incited by stress or fatigue at work.  Symptoms are typically short-lived, and resolve within 30 minutes.  She continues to have intermittent migraine headaches and is followed by neurology.  From a cardiac standpoint, she has done well.  Her blood pressure has been well controlled at home and she does not experience chest pain, dyspnea, palpitations, PND, orthopnea, dizziness, syncope, edema, or early satiety. ? ?Home Medications  ?  ?Prior to Admission medications   ?Medication Sig Start Date End Date Taking? Authorizing Provider  ?busPIRone (BUSPAR) 10 MG tablet Take 10 mg by mouth 2 (two) times daily. 10/10/21  Yes [provider]  ?gabapentin (NEURONTIN) 300 MG capsule 300 mg 2 (two) times daily. 12/22/18  Yes [provider]  ?Galcanezumab-gnlm (EMGALITY) 120 MG/ML SOAJ INJECT 120MG  (1 SYRINGEFUL) SUB-Q EVERY 28 DAYS AS DIRECTED 05/03/21  Yes [provider]  ?LO756 BLOOD GLUCOSE TEST test strip See admin instructions. 01/28/20  Yes [provider]  ?glimepiride (AMARYL) 2 MG tablet Take 2 mg by mouth at bedtime.   Yes [provider]  ?glimepiride (AMARYL) 4 MG tablet Take by mouth. 09/17/19 10/11/21 Yes [provider]  ?HYDROcodone-acetaminophen (NORCO/VICODIN) 5-325 MG tablet TAKE 1/2 TO 1 TABLET TWICE A DAY AS NEEDED 11/16/13  Yes [provider]  ?JARDIANCE 25 MG TABS tablet Take 25 mg by mouth daily. 09/16/21  Yes [provider]  ?lisinopril (ZESTRIL) 20  MG tablet TAKE ONE TABLET EVERY DAY 10/17/20  Yes End, Harrell Gave, MD  ?metFORMIN (GLUCOPHAGE) 500 MG tablet Take 2 tablets (1000 mg) twice daily with food 01/19/20  Yes [provider]  ?ondansetron (ZOFRAN-ODT) 4 MG disintegrating tablet TAKE ONE TABLET BY MOUTH EVERY 8 HOURS AS NEEDED 01/06/20  Yes Marcial Pacas, MD  ?rizatriptan (MAXALT) 10 MG tablet TAKE 1 TABLET BY MOUTH AS  NEEDED FOR MIGRAINE. MAY REPEAT IN 2 HOURS IF NEEDED. 12/09/19  Yes Marcial Pacas, MD  ?tirzepatide [facility]) 7.5 MG/0.5ML Pen Inject into the skin. 09/19/21  Yes [provider]  ?tiZANidine (ZANAFLEX) 4 MG capsule Take 8 mg by mouth as needed.  05/02/18  Yes [provider]  ?topiramate (TOPAMAX) 100 MG tablet Take 1 tablet (100 mg total) by mouth at bedtime. 12/23/19  Yes Marcial Pacas, MD  ?triamterene-hydrochlorothiazide (DYAZIDE) 37.5-25 MG capsule TAKE 1 CAPSULE BY MOUTH ONCE DAILY 10/17/20  Yes End, Harrell Gave, MD  ?venlafaxine XR (EFFEXOR XR) 75 MG 24 hr capsule Take 1 capsule (75 mg total) by mouth at bedtime. 12/09/19  Yes Marcial Pacas, MD  ?propranolol (INDERAL) 20 MG tablet Take 20 mg by mouth 2 (two) times daily. 12/15/19 12/14/20  [provider]  ?  ?  ? ?Review of Systems  ?  ?Occasional vertiginous symptoms and headaches.  She denies chest pain, palpitations, dyspnea, pnd, orthopnea, n, v, dizziness, syncope, edema, weight gain, or early satiety.  All other systems reviewed and are otherwise negative except as noted above. ?  ? ?Physical Exam  ?  ?VS:  BP 100/62 (BP Location: Left Arm, Patient Position: Sitting, Cuff Size: Large)   Pulse 79   Ht 5\' 7"  (1.702 m)   Wt 234 lb (106.1 kg)   BMI 36.65 kg/m?  , BMI Body mass index is 36.65 kg/m?. ?    ?GEN: Well nourished, well developed, in no acute distress. ?HEENT: normal. ?Neck: Supple, no JVD, carotid bruits, or masses. ?Cardiac: RRR, 1/6 systolic murmur at the apex, no rubs, or gallops. No clubbing, cyanosis, edema.  Radials/PT 2+ and equal bilaterally.  ?Respiratory:  Respirations regular and unlabored, clear to auscultation bilaterally. ?GI: Soft, nontender, nondistended, BS + x 4. ?MS: no deformity or atrophy. ?Skin: warm and dry, no rash. ?Neuro:  Strength and sensation are intact. ?Psych: Normal affect. ? ?Accessory Clinical Findings  ?  ?ECG personally reviewed by me today -regular sinus rhythm, 79, inferior infarct- no acute  changes. ? ?Labs dated September 12, 2021 from care everywhere: ? ?Hemoglobin 12.9, hematocrit 40.7, WBC 9.7, platelets 235 ?Sodium 136, potassium 4.0, chloride 101, CO2 24.6, BUN 24, creatinine 1.2, glucose 105 ?Calcium 9.1, albumin 4.4, total protein 7.6 ?total bilirubin 0.3, alkaline phosphatase 49, AST 18, ALT 22 ?Hemoglobin A1c 6.3 ?Total cholesterol 177, triglycerides 472, HDL 30.6, LDL not calculated ?TSH 2.777 ? ?Assessment & Plan  ?  ?1.  Essential hypertension: This has been well controlled on ACE inhibitor and diuretic therapy.  She also uses propranolol twice daily for migraine headaches.  Labs in January showed slight rise in creatinine at 1.2, and this is being followed by primary care. ? ?2.  Reported history of mitral valve disease: Echocardiogram in May 2021 showed normal LV function without any significant valvular disease.  Discussed again today and reassurance offered. ? ?3.  Hyperlipidemia/hypertriglyceridemia: Total cholesterol 177 with triglycerides of 472 in January.  A1c reasonably well controlled at 6.3.  Would benefit from regular exercise and weight loss and with diabetes history, would consider  initiation of statin or fibrate-defer to primary care, as this has been followed there. ? ?4.  Type 2 diabetes mellitus: A1c 6.3.  Management per primary care. ? ?5.  Disposition: Patient will follow-up here as needed. ? ? ?Murray Hodgkins, NP ?10/11/2021, 5:48 PM ? ?

## 2021-10-16 ENCOUNTER — Other Ambulatory Visit: Payer: Self-pay | Admitting: Internal Medicine

## 2022-03-21 ENCOUNTER — Other Ambulatory Visit: Payer: Self-pay | Admitting: Internal Medicine

## 2022-03-21 DIAGNOSIS — Z1231 Encounter for screening mammogram for malignant neoplasm of breast: Secondary | ICD-10-CM

## 2022-04-23 ENCOUNTER — Ambulatory Visit
Admission: RE | Admit: 2022-04-23 | Discharge: 2022-04-23 | Disposition: A | Discharge status: Home / Self Care | Payer: BC Managed Care – PPO | Source: Ambulatory Visit | Attending: Internal Medicine | Admitting: Internal Medicine

## 2022-04-23 DIAGNOSIS — Z1231 Encounter for screening mammogram for malignant neoplasm of breast: Secondary | ICD-10-CM | POA: Diagnosis present

## 2022-04-27 ENCOUNTER — Other Ambulatory Visit: Payer: Self-pay | Admitting: Internal Medicine

## 2022-04-27 DIAGNOSIS — N6489 Other specified disorders of breast: Secondary | ICD-10-CM

## 2022-04-27 DIAGNOSIS — R928 Other abnormal and inconclusive findings on diagnostic imaging of breast: Secondary | ICD-10-CM

## 2022-05-16 ENCOUNTER — Other Ambulatory Visit: Payer: BC Managed Care – PPO

## 2022-05-16 ENCOUNTER — Inpatient Hospital Stay: Admission: RE | Admit: 2022-05-16 | Payer: BC Managed Care – PPO | Source: Ambulatory Visit

## 2022-10-23 ENCOUNTER — Other Ambulatory Visit: Payer: Self-pay | Admitting: Internal Medicine

## 2022-10-23 NOTE — Telephone Encounter (Signed)
LVM to schedule appt

## 2022-10-23 NOTE — Telephone Encounter (Signed)
Please contact pt for future f/u. Pt needing 12 month f/u due to needing refills.

## 2023-05-10 ENCOUNTER — Other Ambulatory Visit: Payer: Self-pay | Admitting: Internal Medicine

## 2023-05-10 DIAGNOSIS — N6489 Other specified disorders of breast: Secondary | ICD-10-CM

## 2023-05-27 ENCOUNTER — Ambulatory Visit
Admission: RE | Admit: 2023-05-27 | Discharge: 2023-05-27 | Disposition: A | Discharge status: Home / Self Care | Payer: BC Managed Care – PPO | Source: Ambulatory Visit | Attending: Internal Medicine | Admitting: Internal Medicine

## 2023-05-27 DIAGNOSIS — N6489 Other specified disorders of breast: Secondary | ICD-10-CM | POA: Insufficient documentation

## 2024-03-16 ENCOUNTER — Other Ambulatory Visit
Admission: RE | Admit: 2024-03-16 | Discharge: 2024-03-16 | Disposition: A | Discharge status: Home / Self Care | Payer: Self-pay | Source: Ambulatory Visit | Attending: Internal Medicine | Admitting: Internal Medicine

## 2024-03-16 DIAGNOSIS — R710 Precipitous drop in hematocrit: Secondary | ICD-10-CM | POA: Diagnosis present

## 2024-03-16 LAB — CBC WITH DIFFERENTIAL/PLATELET
Abs Immature Granulocytes: 2.14 K/uL — ABNORMAL HIGH (ref 0.00–0.07)
Basophils Absolute: 0.1 K/uL (ref 0.0–0.1)
Basophils Relative: 0 %
Eosinophils Absolute: 0.2 K/uL (ref 0.0–0.5)
Eosinophils Relative: 1 %
HCT: 43.1 % (ref 36.0–46.0)
Hemoglobin: 12.7 g/dL (ref 12.0–15.0)
Immature Granulocytes: 12 %
Lymphocytes Relative: 14 %
Lymphs Abs: 2.4 K/uL (ref 0.7–4.0)
MCH: 24.6 pg — ABNORMAL LOW (ref 26.0–34.0)
MCHC: 29.5 g/dL — ABNORMAL LOW (ref 30.0–36.0)
MCV: 83.4 fL (ref 80.0–100.0)
Monocytes Absolute: 0.9 K/uL (ref 0.1–1.0)
Monocytes Relative: 5 %
Neutro Abs: 12 K/uL — ABNORMAL HIGH (ref 1.7–7.7)
Neutrophils Relative %: 68 %
Platelets: 625 K/uL — ABNORMAL HIGH (ref 150–400)
RBC: 5.17 MIL/uL — ABNORMAL HIGH (ref 3.87–5.11)
RDW: 17.2 % — ABNORMAL HIGH (ref 11.5–15.5)
Smear Review: NORMAL
WBC: 17.6 K/uL — ABNORMAL HIGH (ref 4.0–10.5)
nRBC: 0 % (ref 0.0–0.2)

## 2024-05-08 ENCOUNTER — Other Ambulatory Visit: Payer: Self-pay | Admitting: Internal Medicine

## 2024-05-08 DIAGNOSIS — Z1231 Encounter for screening mammogram for malignant neoplasm of breast: Secondary | ICD-10-CM
